# Patient Record
Sex: Female | Born: 1935 | Race: White | Hispanic: No | Smoking: Never smoker
Health system: Southern US, Community
[De-identification: ages and names within clinical notes are randomized; demographics above are authoritative.]

## PROBLEM LIST (undated history)

## (undated) DIAGNOSIS — K219 Gastro-esophageal reflux disease without esophagitis: Secondary | ICD-10-CM

## (undated) DIAGNOSIS — I10 Essential (primary) hypertension: Secondary | ICD-10-CM

## (undated) DIAGNOSIS — F0390 Unspecified dementia without behavioral disturbance: Secondary | ICD-10-CM

## (undated) DIAGNOSIS — H3530 Unspecified macular degeneration: Secondary | ICD-10-CM

## (undated) HISTORY — PX: ABDOMINAL HYSTERECTOMY: SHX81

## (undated) HISTORY — PX: COLONOSCOPY: SHX174

## (undated) HISTORY — PX: TOTAL HIP ARTHROPLASTY: SHX124

## (undated) HISTORY — DX: Gastro-esophageal reflux disease without esophagitis: K21.9

---

## 2003-05-01 ENCOUNTER — Other Ambulatory Visit: Payer: Self-pay

## 2004-06-23 ENCOUNTER — Ambulatory Visit: Payer: Self-pay | Admitting: Internal Medicine

## 2004-08-18 ENCOUNTER — Ambulatory Visit: Payer: Self-pay | Admitting: Internal Medicine

## 2005-02-10 ENCOUNTER — Ambulatory Visit: Payer: Self-pay | Admitting: Internal Medicine

## 2005-05-01 ENCOUNTER — Ambulatory Visit: Payer: Self-pay | Admitting: Unknown Physician Specialty

## 2005-06-15 ENCOUNTER — Ambulatory Visit: Payer: Self-pay | Admitting: Unknown Physician Specialty

## 2005-06-27 ENCOUNTER — Ambulatory Visit: Payer: Self-pay | Admitting: Ophthalmology

## 2005-07-03 ENCOUNTER — Ambulatory Visit: Payer: Self-pay | Admitting: Ophthalmology

## 2005-07-24 ENCOUNTER — Ambulatory Visit: Payer: Self-pay | Admitting: Ophthalmology

## 2005-07-25 ENCOUNTER — Encounter: Payer: Self-pay | Admitting: Rheumatology

## 2005-09-04 ENCOUNTER — Ambulatory Visit: Payer: Self-pay | Admitting: Internal Medicine

## 2006-02-27 ENCOUNTER — Ambulatory Visit: Payer: Self-pay | Admitting: Internal Medicine

## 2006-07-20 ENCOUNTER — Ambulatory Visit: Payer: Self-pay | Admitting: General Practice

## 2006-08-29 ENCOUNTER — Ambulatory Visit: Payer: Self-pay | Admitting: General Practice

## 2006-08-29 ENCOUNTER — Other Ambulatory Visit: Payer: Self-pay

## 2006-09-11 ENCOUNTER — Ambulatory Visit: Payer: Self-pay | Admitting: General Practice

## 2006-09-18 ENCOUNTER — Encounter: Payer: Self-pay | Admitting: General Practice

## 2006-10-18 ENCOUNTER — Encounter: Payer: Self-pay | Admitting: General Practice

## 2006-12-11 ENCOUNTER — Ambulatory Visit: Payer: Self-pay | Admitting: Family Medicine

## 2007-02-04 ENCOUNTER — Other Ambulatory Visit: Payer: Self-pay

## 2007-02-04 ENCOUNTER — Emergency Department: Payer: Self-pay | Admitting: Emergency Medicine

## 2007-12-12 ENCOUNTER — Ambulatory Visit: Payer: Self-pay | Admitting: Family Medicine

## 2010-01-28 ENCOUNTER — Ambulatory Visit: Payer: Self-pay | Admitting: Obstetrics and Gynecology

## 2010-07-04 ENCOUNTER — Ambulatory Visit: Payer: Self-pay | Admitting: Family Medicine

## 2011-01-02 ENCOUNTER — Emergency Department: Payer: Self-pay | Admitting: Emergency Medicine

## 2011-10-24 ENCOUNTER — Encounter: Payer: Self-pay | Admitting: Internal Medicine

## 2012-01-24 ENCOUNTER — Emergency Department: Payer: Self-pay

## 2012-01-24 LAB — CBC
HCT: 34.5 % — ABNORMAL LOW (ref 35.0–47.0)
HGB: 11.5 g/dL — ABNORMAL LOW (ref 12.0–16.0)
MCH: 27.1 pg (ref 26.0–34.0)
MCHC: 33.4 g/dL (ref 32.0–36.0)
Platelet: 332 10*3/uL (ref 150–440)
RDW: 13.1 % (ref 11.5–14.5)

## 2012-01-24 LAB — URINALYSIS, COMPLETE
Blood: NEGATIVE
Glucose,UR: NEGATIVE mg/dL (ref 0–75)
Ketone: NEGATIVE
Leukocyte Esterase: NEGATIVE
Nitrite: NEGATIVE
Protein: NEGATIVE
Specific Gravity: 1.01 (ref 1.003–1.030)

## 2012-01-24 LAB — COMPREHENSIVE METABOLIC PANEL
Albumin: 3.1 g/dL — ABNORMAL LOW (ref 3.4–5.0)
Alkaline Phosphatase: 100 U/L (ref 50–136)
Anion Gap: 5 — ABNORMAL LOW (ref 7–16)
Creatinine: 0.73 mg/dL (ref 0.60–1.30)
EGFR (African American): >60
EGFR (Non-African Amer.): >60
Glucose: 96 mg/dL (ref 65–99)
Osmolality: 276 (ref 275–301)
SGOT(AST): 21 U/L (ref 15–37)
Sodium: 138 mmol/L (ref 136–145)
Total Protein: 6.9 g/dL (ref 6.4–8.2)

## 2013-07-31 ENCOUNTER — Inpatient Hospital Stay: Payer: Self-pay | Admitting: Emergency Medicine

## 2013-07-31 LAB — COMPREHENSIVE METABOLIC PANEL
Albumin: 3.4 g/dL (ref 3.4–5.0)
Alkaline Phosphatase: 96 U/L
Anion Gap: 5 — ABNORMAL LOW (ref 7–16)
BILIRUBIN TOTAL: 0.3 mg/dL (ref 0.2–1.0)
BUN: 17 mg/dL (ref 7–18)
CO2: 31 mmol/L (ref 21–32)
CREATININE: 0.65 mg/dL (ref 0.60–1.30)
Calcium, Total: 9.2 mg/dL (ref 8.5–10.1)
Chloride: 101 mmol/L (ref 98–107)
EGFR (African American): >60
Glucose: 91 mg/dL (ref 65–99)
Osmolality: 275 (ref 275–301)
POTASSIUM: 4 mmol/L (ref 3.5–5.1)
SGOT(AST): 18 U/L (ref 15–37)
SGPT (ALT): 23 U/L (ref 12–78)
SODIUM: 137 mmol/L (ref 136–145)
TOTAL PROTEIN: 7.3 g/dL (ref 6.4–8.2)

## 2013-07-31 LAB — CBC WITH DIFFERENTIAL/PLATELET
BASOS ABS: 0.1 10*3/uL (ref 0.0–0.1)
Basophil %: 0.5 %
Eosinophil #: 0.1 10*3/uL (ref 0.0–0.7)
Eosinophil %: 0.8 %
HCT: 38.1 % (ref 35.0–47.0)
HGB: 12.6 g/dL (ref 12.0–16.0)
LYMPHS ABS: 2.4 10*3/uL (ref 1.0–3.6)
LYMPHS PCT: 17.1 %
MCH: 26.7 pg (ref 26.0–34.0)
MCHC: 33 g/dL (ref 32.0–36.0)
MCV: 81 fL (ref 80–100)
Monocyte #: 0.7 x10 3/mm (ref 0.2–0.9)
Monocyte %: 5 %
NEUTROS ABS: 10.9 10*3/uL — AB (ref 1.4–6.5)
NEUTROS PCT: 76.6 %
Platelet: 307 10*3/uL (ref 150–440)
RBC: 4.72 10*6/uL (ref 3.80–5.20)
RDW: 13.8 % (ref 11.5–14.5)
WBC: 14.3 10*3/uL — AB (ref 3.6–11.0)

## 2013-07-31 LAB — URINALYSIS, COMPLETE
BLOOD: NEGATIVE
Bacteria: NONE SEEN
Bilirubin,UR: NEGATIVE
Glucose,UR: NEGATIVE mg/dL (ref 0–75)
KETONE: NEGATIVE
LEUKOCYTE ESTERASE: NEGATIVE
Nitrite: NEGATIVE
Ph: 7 (ref 4.5–8.0)
Protein: NEGATIVE
RBC,UR: <1 /HPF (ref 0–5)
SPECIFIC GRAVITY: 1.008 (ref 1.003–1.030)
WBC UR: <1 /HPF (ref 0–5)

## 2013-07-31 LAB — PROTIME-INR
INR: 1
PROTHROMBIN TIME: 12.9 s (ref 11.5–14.7)

## 2013-07-31 LAB — TROPONIN I: Troponin-I: <0.02 ng/mL

## 2013-07-31 LAB — LIPASE, BLOOD: Lipase: 122 U/L (ref 73–393)

## 2013-07-31 LAB — MAGNESIUM: MAGNESIUM: 2 mg/dL

## 2013-07-31 LAB — APTT: Activated PTT: 36.7 secs — ABNORMAL HIGH (ref 23.6–35.9)

## 2013-08-02 LAB — BASIC METABOLIC PANEL
ANION GAP: 5 — AB (ref 7–16)
BUN: 14 mg/dL (ref 7–18)
CHLORIDE: 106 mmol/L (ref 98–107)
Calcium, Total: 8.3 mg/dL — ABNORMAL LOW (ref 8.5–10.1)
Co2: 26 mmol/L (ref 21–32)
Creatinine: 0.79 mg/dL (ref 0.60–1.30)
EGFR (African American): >60
Glucose: 103 mg/dL — ABNORMAL HIGH (ref 65–99)
OSMOLALITY: 275 (ref 275–301)
Potassium: 3.6 mmol/L (ref 3.5–5.1)
SODIUM: 137 mmol/L (ref 136–145)

## 2013-08-02 LAB — PLATELET COUNT: Platelet: 251 10*3/uL (ref 150–440)

## 2013-08-02 LAB — HEMOGLOBIN: HGB: 10.7 g/dL — ABNORMAL LOW (ref 12.0–16.0)

## 2013-08-03 LAB — HEMOGLOBIN: HGB: 12 g/dL (ref 12.0–16.0)

## 2013-08-04 ENCOUNTER — Encounter: Payer: Self-pay | Admitting: Internal Medicine

## 2013-08-05 LAB — PATHOLOGY REPORT

## 2013-08-17 ENCOUNTER — Encounter: Payer: Self-pay | Admitting: Internal Medicine

## 2014-10-10 NOTE — H&P (Signed)
PATIENT NAME:  [name], [name] MR#:  [id number] DATE OF BIRTH:  December 01, 1923  DATE OF ADMISSION:  07/31/2013  PRIMARY CARE PHYSICIAN: Dr. Terance Hart.   CHIEF COMPLAINT: Right hip pain after fall today.   HISTORY OF PRESENT ILLNESS: [name] is a pleasant 81 year old Caucasian female with past medical history of AFib in the remote past with history of hypertension, hypercholesteremia, not on any medications, who comes to the Emergency Room after she had a mechanical fall at her apartment today. She started having right hip pain and x-ray shows she has right femoral subcapital fracture. She is being admitted for further evaluation and management.   The patient denies any chest pain, shortness of breath. She is very functional, went grocery shopping and at the hair salon today. She continues to drive and is able to take care of herself. She does not have any history of MI in the past.   PAST MEDICAL HISTORY:  As listed.  1.  Osteoarthritis.  2.  Lumbar disk disease.  3.  History of atrial fibrillation, on aspirin.  4.  Hypertension, not on any medication.  5.  Osteopenia.  6.  Macular degeneration.  7.  History of melanoma.   PAST SURGICAL HISTORY:  1.  Hammertoe surgery.  2.  Hysterectomy.  3.  Cataract surgery x2 in 2006.  4.  Right knee arthroscopy in 2008.  5.  Bilateral varicose vein laser surgery.  6.  Carpal tunnel surgery.   MEDICATIONS:  1.  Vitamin B6 p.o. daily.  2.  Vitamin B12 p.o. daily.  3.  Ocuvite p.o. daily.  4.  Motrin 200 mg every 6 hours as needed.  5.  Metoprolol 50 mg half a tablet b.i.d.  6.  CoQ-10 100 one capsule daily.  7.  Aspirin 81 mg daily.   ALLERGIES: DELTASONE, FLOXIN, FOSAMAX, HYDROCHLOROTHIAZIDE, AND MOBIC.   FAMILY HISTORY: Positive for hypertension.   SOCIAL HISTORY: The patient lives by herself in her own apartment. She is very functional. Nonsmoker, nonalcoholic. She continues to drive.   REVIEW OF SYSTEMS:  CONSTITUTIONAL: No fever or  fatigue. Positive for right hip pain.  EYES: No blurred or double vision, glaucoma.  ENT: No tinnitus, ear pain, hearing loss or epistaxis.  RESPIRATORY: No cough, wheeze, hemoptysis or dyspnea.  CARDIOVASCULAR: No chest pain, orthopnea, edema, arrhythmia or dyspnea on exertion.  GASTROINTESTINAL: No nausea, vomiting, diarrhea, abdominal pain or hematemesis.  GENITOURINARY: No dysuria, hematuria, renal calculus.  ENDOCRINE: No polyuria, nocturia or thyroid problems.  HEMATOLOGY: No anemia, easy bruising or bleeding.  SKIN: No acne, rash, or lesion.  MUSCULOSKELETAL: Positive for arthritis, swelling and gout. NEUROLOGIC: No numbness, weakness, dysarthria, or CVA.  PSYCHIATRIC: No anxiety, depression or bipolar disorder.   All other systems reviewed and negative.   FAMILY HISTORY: Positive for hypertension.   FAMILY HISTORY: Positive for hypertension and stroke. Father with colon cancer, sister with breast cancer.   PHYSICAL EXAMINATION:  GENERAL: Awake, alert, oriented x3, not in acute distress.  VITAL SIGNS: Afebrile. Blood pressure is 167/70. Sats are 95% on room air.  HEENT: Atraumatic, normocephalic. PERRLA. EOM intact. Oral mucosa is moist.  NECK: Supple. No JVD. No carotid bruit.  LUNGS: Clear to auscultation bilaterally. No rales, rhonchi, respiratory distress or labored breathing.  HEART: Both the heart sounds are normal. Rate, rhythm regular. PMI not lateralized.  CHEST: Nontender.  EXTREMITIES: Good pedal pulses, good femoral pulses. No lower extremity edema.  ABDOMEN: Soft, benign, nontender. No organomegaly. Positive bowel sounds.  NEUROLOGIC:  Grossly intact cranial nerves II through XII. No motor or sensory deficit.  PSYCHIATRIC: Awake, alert, oriented x3.   DIAGNOSTIC STUDIES: EKG shows normal sinus rhythm with left bundle branch block which is consistent with EKG from 2013.   Comprehensive metabolic panel within normal limits. Lipase is 122. Magnesium is 2. PT/INR is  12 and 1.0.   Right hip x-ray shows subcapital right femoral neck fracture.   ASSESSMENT AND PLAN: This is 81 year old [name] with history of chronic atrial fibrillation, now in sinus rhythm, history of osteoarthritis, who comes in with:  1.  Right subcapital femur fracture after a mechanical fall at her apartment today. The patient will be admitted on the orthopedic service. Dr. Ernest PineHooten has been consulted. Input appreciated. The patient is at a low risk for hip surgery at this time. Okay to proceed with surgery. She does not have any history of coronary artery disease. She is currently in sinus rhythm and has a remote history of atrial fibrillation the past. The patient denies any chest pain and is very functional.  2.  History of atrial fibrillation. Currently, the patient is in sinus rhythm. Continue aspirin after surgery.  3.  Hypertension. On metoprolol. Will continue the same in the perioperative period.  4.  Deep vein thrombosis prophylaxis. Subcu heparin.  5.  Further workup according to the patient's clinical course.   Hospital admission plan was discussed with the patient and the patient's daughter, Delbert HarnessJeanie Ward.   TIME SPENT: 50 minutes.   ____________________________ Wylie HailSona A. Allena KatzPatel, MD sap:np D: 07/31/2013 21:09:50 ET T: 07/31/2013 21:46:15 ET JOB#: [id number]  cc: Sharlette Jansma A. Allena KatzPatel, MD, <Dictator> Teena Iraniavid M. Terance HartBronstein, MD Willow OraSONA A Cena Bruhn MD ELECTRONICALLY SIGNED 08/07/2013 17:13

## 2014-10-10 NOTE — Discharge Summary (Signed)
PATIENT NAME:  [name], [name] MR#:  [id number] DATE OF BIRTH:  08/10/1923  DATE OF ADMISSION:  07/31/2013 DATE OF DISCHARGE:  08/04/2013  ADMITTING PHYSICIAN: Enedina FinnerSona Patel, MD  DISCHARGING PHYSICIAN: Enid Baasadhika Emrah Ariola, MD  PRIMARY CARE PHYSICIAN: Dr. Terance HartBronstein.  CONSULTANTS IN HOSPITAL: Orthopedics by Dr. Ernest PineHooten.   DISCHARGE DIAGNOSES: 1.  Fall and right subcapital fracture status post hemiarthroplasty.  2.  Paroxysmal atrial fibrillation. 3.  Left bundle branch block.  4.  Gastroesophageal reflux disease.  5. Osteoarthritis, lumbar disk disease, and osteopenia. 6.  Macular degeneration.  7.  History of melanoma.   DISCHARGE HOME MEDICATIONS: 1.  Aspirin 81 mg p.o. daily.  2.  Co-enzyme Q 100 mg 1 capsule daily.  3.  Vitamin B12 1 tablet p.o. daily. 4.  Vitamin B6 1 tablet p.o. daily.  5.  Multivitamin 1 tablet p.o. daily.  6.  Metoprolol 25 mg p.o. b.i.d.  7.  Tylenol 500 mg q. 4 hours p.r.n. for pain or fever.  8.  Oxycodone 5 mg q. 4 hours p.r.n. for pain.  9.  Tramadol 50 mg q. 4 hours p.r.n. for pain. 10. Lovenox 40 mg subcutaneous daily for 14 days.  11.  Senna Colace 8.6/50 mg 1 tablet p.o. b.i.d.  12.  Magnesium hydroxide 30 mL b.i.d. p.r.n. for constipation.   DISCHARGE DIET: Low-sodium.  DISCHARGE ACTIVITY: As tolerated.    DISCHARGE INSTRUCTIONS: 1.  Follow up with PCP in 1 week. 2.  Follow up with orthopedics in 2 weeks, Dr. Ernest PineHooten. 3.  Physical therapy.   LABS AND IMAGING STUDIES PRIOR TO DISCHARGE: Sodium 137, potassium 3.6, chloride 106, bicarb 26, BUN 14, creatinine 0.79, glucose 103, and calcium 8.3.   WBC 14.3, hemoglobin 12, hematocrit 38.1, platelet count 307. Lipase 122. Magnesium 2. INR 1. Troponins negative. UA negative for infection.   Chest x-ray on admission showing clear lung fields.   Right hip x-ray on admission showing subcapital right femoral neck fracture.   BRIEF HOSPITAL COURSE: [name] is an 81 year old pleasant Caucasian female with  past medical history of paroxysmal atrial fibrillation on aspirin, hypertension, and osteopenia who is very active at baseline, presents after a mechanical fall and right hip fracture.  1.  Mechanical fall and right hip subcapital fracture status post hemiarthroplasty done by Dr. Ernest PineHooten on August 01, 2013. Postoperatively the patient has done extensively well. She has not had any complications, minimal postop anemia that resolved without any treatment. The patient worked with physical therapy. Pain is better controlled. She had a bowel movement, and [facility].   2.  Paroxysmal atrial fibrillation. She was in normal sinus rhythm prior to surgery. Had an episode of intense pain postoperative day 2, converted back into A-fib but is back to sinus at the time of discharge. The patient is currently only on minimal anticoagulation with aspirin as an outpatient, which will be continued. She will be getting Lovenox for DVT prophylaxis anyway for the next 2 weeks and will follow up with PCP as an outpatient.  3.  Hypertension. Continue metoprolol.   Her course has been otherwise uneventful in the hospital.   DISCHARGE CONDITION: Stable.   DISCHARGE DISPOSITION: [facility] short-term rehab.   TIME SPENT ON DISCHARGE: 45 minutes.   ____________________________ Enid Baasadhika Keishon Chavarin, MD rk:sb D: 08/04/2013 11:47:10 ET T: 08/04/2013 12:20:47 ET JOB#: [id number]  cc: Enid Baasadhika Scout Guyett, MD, <Dictator> Illene LabradorJames P. Angie FavaHooten Jr., MD Teena Iraniavid M. Terance HartBronstein, MD Enid BaasADHIKA Shaarav Ripple MD ELECTRONICALLY SIGNED 08/05/2013 15:05

## 2014-10-10 NOTE — Op Note (Signed)
PATIENT NAME:  [name], [name] MR#:  [id number] DATE OF BIRTH:  [birth year]  DATE OF PROCEDURE:  08/01/2013  PREOPERATIVE DIAGNOSIS: Displaced right femoral neck fracture.   POSTOPERATIVE DIAGNOSIS: Displaced right femoral neck fracture.   PROCEDURE PERFORMED: Right hip hemiarthroplasty.   SURGEON: Illene LabradorJames P. Hooten, M.D.   ASSISTANT:  Van ClinesJon Wolfe.   ANESTHESIA: Spinal.   ESTIMATED BLOOD LOSS: 100 mL.   FLUIDS REPLACED: 1300 mL of crystalloid.   DRAINS: Two medium drains to Hemovac reservoir.   IMPLANTS UTILIZED: DePuy size 2 Summit femoral stem (cemented), 11 mm Cementralizer, a 45 mm Cathcart hip ball, a +5 mm tapered spacer, and a size 2 cement restrictor.   INDICATIONS FOR SURGERY: The patient is an 81 year old female who fell and landed on her right hip and side. X-rays demonstrated a displaced right femoral neck fracture. After discussion of the risks and benefits of surgical intervention, the patient expressed understanding of the risks, benefits, and agreed with plans for surgical intervention.   PROCEDURE IN DETAIL: The patient was brought to the operating room and after adequate spinal anesthesia was achieved, the patient was placed in left lateral decubitus position. Axillary roll was placed and all bony prominences were well padded. The patient's right hip and leg were cleaned and prepped with alcohol and DuraPrep draped in the usual sterile fashion. A "timeout" was performed as per usual protocol. A lateral curvilinear incision was made gently curving towards the posterior superior iliac spine. IT band was incised in line with the skin incision. Fibers of the gluteus maximus were split in line. Piriformis tendon was identified, skeletonized, incised at its insertion at the proximal femur and reflected posteriorly. In a similar fashion, short external rotators were incised and reflected posteriorly. A T-type posterior capsulotomy was performed. The femoral head was removed using a  corkscrew device and measured using calipers.  It was felt that a 45 mm diameter was appropriate. The acetabulum was inspected and articular cartilage felt to be in excellent condition. Several small bony fragments were removed from the acetabulum. The femoral neck cut was then performed using an oscillating saw. A pilot hole was created, and conical reamer was inserted. Serial broaches were inserted up to a size 2 broach. The calcar region was planed and trial reduction was performed with a 45 mm outer diameter hip ball with first a +0 and subsequently a +5 mm neck length. The +5 mm neck length allowed for appropriate restoration of limb length. Excellent stability was noted both anteriorly and posteriorly. Trial components were removed. The canal was measured and it was felt that a size 2 cement restrictor was appropriate. The cement restrictor was inserted to the appropriate depth. Proximal femoral canal was irrigated with copious amounts of normal saline with antibiotic solution using pulsatile lavage and then suctioned dry. The canal was then packed with vaginal packing, soaked in dilute Neo-Synephrine. Polymethyl methacrylate cement was prepared in the usual fashion using a vacuum mixer. Vaginal packing was removed and the canal again suctioned dry. Cement was introduced in a retrograde fashion and pressurized. A size 2 Summit femoral stem with an 11 mm Cementralizer was positioned and impacted into place. Excess cement was removed using freer elevators. After adequate curing of cement, the Morse taper was cleaned and dried and a 45 mm outer diameter Cathcart hip ball with a +5 mm tapered spacer was placed on the trunnion and impacted into place. The acetabulum was again irrigated and suctioned dry and inspected for any debris. The  hip was reduced and placed through range of motion. Again, excellent stability was noted both anteriorly and posteriorly. Good equalization of limb lengths was noted. The wound was  irrigated with copious amounts of normal saline with antibiotic solution using pulsatile lavage and then suctioned dry. The posterior capsulotomy was repaired using #5 Ethibond. The piriformis tendon was reapproximated on the surface of the gluteus medius tendon using #5 Ethibond. Two medium drains were placed in the wound bed and brought out through a separate stab incision to be attached to a Hemovac reservoir. IT band was repaired using interrupted sutures of #1 Vicryl. The subcutaneous tissue was approximated in layers using first #0 Vicryl followed by #2-0 Vicryl. Skin was closed with skin staples. A sterile dressing was applied.   The patient tolerated the procedure well. She was transported to the recovery room in stable condition.    ____________________________ Illene Labrador. Angie Fava., MD jph:dp D: 08/02/2013 07:24:40 ET T: 08/02/2013 07:52:19 ET JOB#: [id number]  cc: Fayrene Fearing P. Angie Fava., MD, <Dictator> Illene Labrador Angie Fava MD ELECTRONICALLY SIGNED 08/08/2013 6:43

## 2014-10-10 NOTE — Consult Note (Signed)
PATIENT NAME:  [name], KAYES MR#:  [id number] DATE OF BIRTH:  1923/07/11  DATE OF CONSULTATION:  07/31/2013  REFERRING PHYSICIAN:  Eartha Inch. York Cerise, MD CONSULTING PHYSICIAN:  Illene Labrador. Angie Fava., MD  CHIEF COMPLAINT: Right hip pain.   HISTORY OF PRESENT ILLNESS: The patient is an 81 year old female who sustained a mechanical fall while at home, landing on her right hip and side. She complained of right hip pain and was unable to stand or bear weight due to the right hip pain. She denied any other injuries. She denied any loss of consciousness.   PAST MEDICAL HISTORY: Atrial fibrillation, positive PPD, hypertension, hypercholesterolemia, osteopenia, peripheral vascular disease of the right lower extremity, macular degeneration, melanoma, status post hammertoe procedure, hysterectomy, cardiac catheterization, cataract extraction x 2, right knee arthroscopy, varicose vein surgery, bilateral carpal tunnel release, laser surgery to the eyes.   ALLERGIES: DELTASONE, FLOXIN, FOSAMAX, HYDROCHLOROTHIAZIDE AND MOBIC.   MEDICATIONS AT THE TIME OF ADMISSION: Vitamin B6 daily, vitamin B12 daily, Ocuvite 1 tablet daily, Motrin 200 mg q.6 hours p.r.n., metoprolol tartrate 50 mg 1/2 tablet twice a day, Coenzyme-Q10 100 mg daily, aspirin 81 mg daily.   SOCIAL HISTORY: The patient is a widow and lives by herself at [place]. She denies any current tobacco or alcohol use.   FAMILY HISTORY: Positive for CVA and hypertension in her mother, colon cancer in her father, breast cancer in a sister, and coronary artery disease in a brother.   REVIEW OF SYSTEMS: Negative for chest pain, shortness of breath, dyspnea on exertion. Pertinent musculoskeletal exam is only notable for right hip pain.   PHYSICAL EXAMINATION:  GENERAL: The patient is a pleasant, well-developed, well-nourished, elderly female seen in no acute distress.  HEENT: Atraumatic, normocephalic. Sclerae are clear. Extraocular motions intact. Oropharynx is  clear with moist mucosa.  NECK: Supple, nontender, with good range of motion. No JVD or carotid bruits.  LUNGS: Clear to auscultation bilaterally.  CARDIAC: Regular rate and rhythm with normal S1, S2. There is a 2/6 systolic murmur. No gallops or rubs. Strong pedal pulses are noted bilaterally. No significant pretibial or ankle edema.  ABDOMEN: Soft, nontender, nondistended. Bowel sounds present.  MUSCULOSKELETAL: Good range of motion, strength, and stability of the upper extremities. Examination of lower extremity demonstrates pain with attempted range of motion of the right hip. Only mild shortening is appreciated to the right lower extremity without gross rotation. No significant bruising about the hip or buttocks at the present time. No swelling or tenderness to the either knee. No knee effusion.  NEUROLOGIC: Awake, alert, and oriented. Sensory function is grossly intact throughout. Motor strength is felt to 5/5 with the exception of the right lower extremity which was not assessed due to the injury. No tremor. Good motor coordination.   X-RAYS: AP pelvis and AP and lateral radiographs of the right hip were reviewed. There is a displaced femoral neck fracture. Good preservation of the cartilage space is noted without significant degenerative change appreciated. Good cortical bone was noted to the proximal femur.   IMPRESSION: Displaced right femoral neck fracture.   PLAN: The findings were discussed in detail with the patient and her daughter. Recommendation was made for right hip hemiarthroplasty. Risks and benefits of surgical intervention were discussed. The usual perioperative course was also reviewed. The patient expressed understanding of the risks and benefits and agreed with plans for surgical intervention.   Right site surgery protocol was performed.   ____________________________ Illene Labrador. Angie Fava., MD jph:gb D:  07/31/2013 21:02:04 ET T: 07/31/2013 21:25:46  ET JOB#: [id number]  cc: Fayrene FearingJames P. Angie FavaHooten Jr., MD, <Dictator> Illene LabradorJAMES P Angie FavaHOOTEN JR MD ELECTRONICALLY SIGNED 08/08/2013 6:43

## 2014-10-10 NOTE — Consult Note (Signed)
Brief Consult Note: Diagnosis: right femoral neck fracture.   Patient was seen by consultant.   Consult note dictated.   Comments: Recommend right hip hemiarthroplasty.  The risks and benefits of surgical intervention were discussed in detail with the patient and her daughter. They expressed understanding of the risks and benefits and agreed with plans for surgery.   Surgical site signed as per "right site surgery" protocol.   The potential risks and benefits of blood transfusion have been discussed with the patient.The patient expressed understanding of the risks and benefits and has signed the appropriate consent for blood transfusion.  Electronic Signatures: Donato HeinzHooten, James P (MD)  (Signed 12-Feb-15 20:55)  Authored: Brief Consult Note   Last Updated: 12-Feb-15 20:55 by Donato HeinzHooten, James P (MD)

## 2015-07-01 DIAGNOSIS — H353131 Nonexudative age-related macular degeneration, bilateral, early dry stage: Secondary | ICD-10-CM | POA: Diagnosis not present

## 2015-09-01 DIAGNOSIS — H353122 Nonexudative age-related macular degeneration, left eye, intermediate dry stage: Secondary | ICD-10-CM | POA: Diagnosis not present

## 2015-09-01 DIAGNOSIS — H35373 Puckering of macula, bilateral: Secondary | ICD-10-CM | POA: Diagnosis not present

## 2015-09-01 DIAGNOSIS — H353211 Exudative age-related macular degeneration, right eye, with active choroidal neovascularization: Secondary | ICD-10-CM | POA: Diagnosis not present

## 2015-09-01 DIAGNOSIS — H43813 Vitreous degeneration, bilateral: Secondary | ICD-10-CM | POA: Diagnosis not present

## 2015-11-17 DIAGNOSIS — I10 Essential (primary) hypertension: Secondary | ICD-10-CM | POA: Diagnosis not present

## 2015-11-17 DIAGNOSIS — R413 Other amnesia: Secondary | ICD-10-CM | POA: Diagnosis not present

## 2015-11-17 DIAGNOSIS — M1710 Unilateral primary osteoarthritis, unspecified knee: Secondary | ICD-10-CM | POA: Diagnosis not present

## 2015-11-17 DIAGNOSIS — K219 Gastro-esophageal reflux disease without esophagitis: Secondary | ICD-10-CM | POA: Diagnosis not present

## 2015-11-19 DIAGNOSIS — M25561 Pain in right knee: Secondary | ICD-10-CM | POA: Diagnosis not present

## 2015-12-28 DIAGNOSIS — H353212 Exudative age-related macular degeneration, right eye, with inactive choroidal neovascularization: Secondary | ICD-10-CM | POA: Diagnosis not present

## 2015-12-28 DIAGNOSIS — H353122 Nonexudative age-related macular degeneration, left eye, intermediate dry stage: Secondary | ICD-10-CM | POA: Diagnosis not present

## 2015-12-28 DIAGNOSIS — H35373 Puckering of macula, bilateral: Secondary | ICD-10-CM | POA: Diagnosis not present

## 2015-12-28 DIAGNOSIS — H43813 Vitreous degeneration, bilateral: Secondary | ICD-10-CM | POA: Diagnosis not present

## 2016-02-10 DIAGNOSIS — I10 Essential (primary) hypertension: Secondary | ICD-10-CM | POA: Diagnosis not present

## 2016-02-10 DIAGNOSIS — L298 Other pruritus: Secondary | ICD-10-CM | POA: Diagnosis not present

## 2016-02-10 DIAGNOSIS — I4891 Unspecified atrial fibrillation: Secondary | ICD-10-CM | POA: Diagnosis not present

## 2016-03-02 DIAGNOSIS — L821 Other seborrheic keratosis: Secondary | ICD-10-CM | POA: Diagnosis not present

## 2016-03-02 DIAGNOSIS — L300 Nummular dermatitis: Secondary | ICD-10-CM | POA: Diagnosis not present

## 2016-03-02 DIAGNOSIS — Z08 Encounter for follow-up examination after completed treatment for malignant neoplasm: Secondary | ICD-10-CM | POA: Diagnosis not present

## 2016-03-02 DIAGNOSIS — Z85828 Personal history of other malignant neoplasm of skin: Secondary | ICD-10-CM | POA: Diagnosis not present

## 2016-03-30 DIAGNOSIS — H353212 Exudative age-related macular degeneration, right eye, with inactive choroidal neovascularization: Secondary | ICD-10-CM | POA: Diagnosis not present

## 2016-05-17 DIAGNOSIS — H35373 Puckering of macula, bilateral: Secondary | ICD-10-CM | POA: Diagnosis not present

## 2016-05-17 DIAGNOSIS — H43813 Vitreous degeneration, bilateral: Secondary | ICD-10-CM | POA: Diagnosis not present

## 2016-05-17 DIAGNOSIS — H353211 Exudative age-related macular degeneration, right eye, with active choroidal neovascularization: Secondary | ICD-10-CM | POA: Diagnosis not present

## 2016-05-17 DIAGNOSIS — H353122 Nonexudative age-related macular degeneration, left eye, intermediate dry stage: Secondary | ICD-10-CM | POA: Diagnosis not present

## 2016-05-22 DIAGNOSIS — M79605 Pain in left leg: Secondary | ICD-10-CM | POA: Diagnosis not present

## 2016-05-22 DIAGNOSIS — I10 Essential (primary) hypertension: Secondary | ICD-10-CM | POA: Diagnosis not present

## 2016-05-22 DIAGNOSIS — R413 Other amnesia: Secondary | ICD-10-CM | POA: Diagnosis not present

## 2016-05-24 DIAGNOSIS — M79605 Pain in left leg: Secondary | ICD-10-CM | POA: Diagnosis not present

## 2016-05-24 DIAGNOSIS — I10 Essential (primary) hypertension: Secondary | ICD-10-CM | POA: Diagnosis not present

## 2016-06-12 ENCOUNTER — Inpatient Hospital Stay: Payer: PPO

## 2016-06-12 ENCOUNTER — Inpatient Hospital Stay: Payer: PPO | Admitting: Anesthesiology

## 2016-06-12 ENCOUNTER — Inpatient Hospital Stay
Admission: EM | Admit: 2016-06-12 | Discharge: 2016-06-16 | DRG: 469 | Disposition: A | Discharge status: Skilled Nursing Facility | Payer: PPO | Attending: Internal Medicine | Admitting: Internal Medicine

## 2016-06-12 ENCOUNTER — Emergency Department: Payer: PPO

## 2016-06-12 ENCOUNTER — Encounter
Admission: EM | Disposition: A | Discharge status: Skilled Nursing Facility | Payer: Self-pay | Source: Home / Self Care | Attending: Internal Medicine

## 2016-06-12 ENCOUNTER — Encounter: Payer: Self-pay | Admitting: Emergency Medicine

## 2016-06-12 DIAGNOSIS — M25552 Pain in left hip: Secondary | ICD-10-CM | POA: Diagnosis not present

## 2016-06-12 DIAGNOSIS — R0989 Other specified symptoms and signs involving the circulatory and respiratory systems: Secondary | ICD-10-CM | POA: Diagnosis not present

## 2016-06-12 DIAGNOSIS — H3530 Unspecified macular degeneration: Secondary | ICD-10-CM | POA: Diagnosis not present

## 2016-06-12 DIAGNOSIS — T148XXA Other injury of unspecified body region, initial encounter: Secondary | ICD-10-CM | POA: Diagnosis not present

## 2016-06-12 DIAGNOSIS — F0390 Unspecified dementia without behavioral disturbance: Secondary | ICD-10-CM | POA: Diagnosis present

## 2016-06-12 DIAGNOSIS — J45909 Unspecified asthma, uncomplicated: Secondary | ICD-10-CM | POA: Diagnosis not present

## 2016-06-12 DIAGNOSIS — Z66 Do not resuscitate: Secondary | ICD-10-CM | POA: Diagnosis present

## 2016-06-12 DIAGNOSIS — S72042A Displaced fracture of base of neck of left femur, initial encounter for closed fracture: Secondary | ICD-10-CM | POA: Diagnosis not present

## 2016-06-12 DIAGNOSIS — R600 Localized edema: Secondary | ICD-10-CM | POA: Diagnosis not present

## 2016-06-12 DIAGNOSIS — S72002A Fracture of unspecified part of neck of left femur, initial encounter for closed fracture: Secondary | ICD-10-CM | POA: Diagnosis not present

## 2016-06-12 DIAGNOSIS — R001 Bradycardia, unspecified: Secondary | ICD-10-CM | POA: Diagnosis present

## 2016-06-12 DIAGNOSIS — Z79899 Other long term (current) drug therapy: Secondary | ICD-10-CM | POA: Diagnosis not present

## 2016-06-12 DIAGNOSIS — S72009A Fracture of unspecified part of neck of unspecified femur, initial encounter for closed fracture: Secondary | ICD-10-CM | POA: Diagnosis not present

## 2016-06-12 DIAGNOSIS — I10 Essential (primary) hypertension: Secondary | ICD-10-CM | POA: Diagnosis present

## 2016-06-12 DIAGNOSIS — E8770 Fluid overload, unspecified: Secondary | ICD-10-CM | POA: Diagnosis not present

## 2016-06-12 DIAGNOSIS — W19XXXA Unspecified fall, initial encounter: Secondary | ICD-10-CM | POA: Diagnosis present

## 2016-06-12 DIAGNOSIS — M6281 Muscle weakness (generalized): Secondary | ICD-10-CM | POA: Diagnosis not present

## 2016-06-12 DIAGNOSIS — R2689 Other abnormalities of gait and mobility: Secondary | ICD-10-CM | POA: Diagnosis not present

## 2016-06-12 DIAGNOSIS — Z7982 Long term (current) use of aspirin: Secondary | ICD-10-CM | POA: Diagnosis not present

## 2016-06-12 DIAGNOSIS — J189 Pneumonia, unspecified organism: Secondary | ICD-10-CM | POA: Diagnosis not present

## 2016-06-12 DIAGNOSIS — S299XXA Unspecified injury of thorax, initial encounter: Secondary | ICD-10-CM | POA: Diagnosis not present

## 2016-06-12 DIAGNOSIS — R05 Cough: Secondary | ICD-10-CM | POA: Diagnosis not present

## 2016-06-12 DIAGNOSIS — S72012A Unspecified intracapsular fracture of left femur, initial encounter for closed fracture: Secondary | ICD-10-CM | POA: Diagnosis not present

## 2016-06-12 DIAGNOSIS — T1490XA Injury, unspecified, initial encounter: Secondary | ICD-10-CM

## 2016-06-12 DIAGNOSIS — Z96642 Presence of left artificial hip joint: Secondary | ICD-10-CM | POA: Diagnosis not present

## 2016-06-12 DIAGNOSIS — Y92000 Kitchen of unspecified non-institutional (private) residence as  the place of occurrence of the external cause: Secondary | ICD-10-CM | POA: Diagnosis not present

## 2016-06-12 DIAGNOSIS — R609 Edema, unspecified: Secondary | ICD-10-CM

## 2016-06-12 DIAGNOSIS — R059 Cough, unspecified: Secondary | ICD-10-CM

## 2016-06-12 DIAGNOSIS — F329 Major depressive disorder, single episode, unspecified: Secondary | ICD-10-CM | POA: Diagnosis not present

## 2016-06-12 DIAGNOSIS — S72002D Fracture of unspecified part of neck of left femur, subsequent encounter for closed fracture with routine healing: Secondary | ICD-10-CM | POA: Diagnosis not present

## 2016-06-12 DIAGNOSIS — K219 Gastro-esophageal reflux disease without esophagitis: Secondary | ICD-10-CM | POA: Diagnosis not present

## 2016-06-12 DIAGNOSIS — R262 Difficulty in walking, not elsewhere classified: Secondary | ICD-10-CM

## 2016-06-12 DIAGNOSIS — Z9181 History of falling: Secondary | ICD-10-CM | POA: Diagnosis not present

## 2016-06-12 DIAGNOSIS — G8918 Other acute postprocedural pain: Secondary | ICD-10-CM

## 2016-06-12 DIAGNOSIS — Z823 Family history of stroke: Secondary | ICD-10-CM | POA: Diagnosis not present

## 2016-06-12 HISTORY — PX: TOTAL HIP ARTHROPLASTY: SHX124

## 2016-06-12 HISTORY — DX: Unspecified dementia, unspecified severity, without behavioral disturbance, psychotic disturbance, mood disturbance, and anxiety: F03.90

## 2016-06-12 HISTORY — DX: Essential (primary) hypertension: I10

## 2016-06-12 HISTORY — DX: Unspecified macular degeneration: H35.30

## 2016-06-12 LAB — CBC WITH DIFFERENTIAL/PLATELET
Basophils Absolute: 0.1 10*3/uL (ref 0–0.1)
Basophils Relative: 1 %
Eosinophils Absolute: 0.1 10*3/uL (ref 0–0.7)
Eosinophils Relative: 2 %
HEMATOCRIT: 36.5 % (ref 35.0–47.0)
Hemoglobin: 12.4 g/dL (ref 12.0–16.0)
Lymphocytes Relative: 30 %
Lymphs Abs: 1.9 10*3/uL (ref 1.0–3.6)
MCH: 27.2 pg (ref 26.0–34.0)
MCHC: 34.1 g/dL (ref 32.0–36.0)
MCV: 79.9 fL — AB (ref 80.0–100.0)
MONO ABS: 0.7 10*3/uL (ref 0.2–0.9)
MONOS PCT: 12 %
NEUTROS ABS: 3.6 10*3/uL (ref 1.4–6.5)
Neutrophils Relative %: 55 %
Platelets: 258 10*3/uL (ref 150–440)
RBC: 4.57 MIL/uL (ref 3.80–5.20)
RDW: 13.6 % (ref 11.5–14.5)
WBC: 6.4 10*3/uL (ref 3.6–11.0)

## 2016-06-12 LAB — URINALYSIS, COMPLETE (UACMP) WITH MICROSCOPIC
Bacteria, UA: NONE SEEN
Bilirubin Urine: NEGATIVE
GLUCOSE, UA: NEGATIVE mg/dL
Hgb urine dipstick: NEGATIVE
KETONES UR: NEGATIVE mg/dL
Leukocytes, UA: NEGATIVE
NITRITE: NEGATIVE
PH: 7 (ref 5.0–8.0)
Protein, ur: NEGATIVE mg/dL
Specific Gravity, Urine: 1.009 (ref 1.005–1.030)
Squamous Epithelial / LPF: NONE SEEN

## 2016-06-12 LAB — COMPREHENSIVE METABOLIC PANEL
ALK PHOS: 74 U/L (ref 38–126)
ALT: 16 U/L (ref 14–54)
AST: 18 U/L (ref 15–41)
Albumin: 3.7 g/dL (ref 3.5–5.0)
Anion gap: 4 — ABNORMAL LOW (ref 5–15)
BILIRUBIN TOTAL: 0.6 mg/dL (ref 0.3–1.2)
BUN: 15 mg/dL (ref 6–20)
CALCIUM: 9.1 mg/dL (ref 8.9–10.3)
CO2: 29 mmol/L (ref 22–32)
Chloride: 99 mmol/L — ABNORMAL LOW (ref 101–111)
Creatinine, Ser: 0.62 mg/dL (ref 0.44–1.00)
GFR calc non Af Amer: >60 mL/min (ref >60)
Glucose, Bld: 101 mg/dL — ABNORMAL HIGH (ref 65–99)
Potassium: 4.1 mmol/L (ref 3.5–5.1)
Sodium: 132 mmol/L — ABNORMAL LOW (ref 135–145)
TOTAL PROTEIN: 6.5 g/dL (ref 6.5–8.1)

## 2016-06-12 SURGERY — ARTHROPLASTY, HIP, TOTAL, ANTERIOR APPROACH
Anesthesia: General | Laterality: Left

## 2016-06-12 MED ORDER — PHENOL 1.4 % MT LIQD
1.0000 | OROMUCOSAL | Status: DC | PRN
  Filled 2016-06-12: qty 177

## 2016-06-12 MED ORDER — PROPOFOL 10 MG/ML IV BOLUS
INTRAVENOUS | Status: AC
  Filled 2016-06-12: qty 20

## 2016-06-12 MED ORDER — FENTANYL CITRATE (PF) 100 MCG/2ML IJ SOLN
INTRAMUSCULAR | Status: DC | PRN
  Administered 2016-06-12: 50 ug via INTRAVENOUS

## 2016-06-12 MED ORDER — OCUVITE ADULT 50+ PO CAPS: 1.0000 | ORAL_CAPSULE | Freq: Every day | ORAL | Status: DC

## 2016-06-12 MED ORDER — PHENYLEPHRINE HCL 10 MG/ML IJ SOLN
INTRAMUSCULAR | Status: AC
  Filled 2016-06-12: qty 1

## 2016-06-12 MED ORDER — SUGAMMADEX SODIUM 200 MG/2ML IV SOLN
INTRAVENOUS | Status: DC | PRN
  Administered 2016-06-12: 140 mg via INTRAVENOUS

## 2016-06-12 MED ORDER — MORPHINE SULFATE (PF) 2 MG/ML IV SOLN: 2.0000 mg | INTRAVENOUS | Status: DC | PRN

## 2016-06-12 MED ORDER — PHENYLEPHRINE 40 MCG/ML (10ML) SYRINGE FOR IV PUSH (FOR BLOOD PRESSURE SUPPORT)
PREFILLED_SYRINGE | INTRAVENOUS | Status: AC
  Filled 2016-06-12: qty 10

## 2016-06-12 MED ORDER — ONDANSETRON HCL 4 MG/2ML IJ SOLN
INTRAMUSCULAR | Status: AC
  Filled 2016-06-12: qty 2

## 2016-06-12 MED ORDER — MAGNESIUM HYDROXIDE 400 MG/5ML PO SUSP
30.0000 mL | Freq: Every day | ORAL | Status: DC | PRN
  Administered 2016-06-13: 30 mL via ORAL
  Filled 2016-06-12: qty 30

## 2016-06-12 MED ORDER — ENOXAPARIN SODIUM 40 MG/0.4ML ~~LOC~~ SOLN
40.0000 mg | SUBCUTANEOUS | Status: DC
  Administered 2016-06-13 – 2016-06-16 (×4): 40 mg via SUBCUTANEOUS
  Filled 2016-06-12 (×4): qty 0.4

## 2016-06-12 MED ORDER — BUPIVACAINE-EPINEPHRINE (PF) 0.25% -1:200000 IJ SOLN
INTRAMUSCULAR | Status: AC
  Filled 2016-06-12: qty 30

## 2016-06-12 MED ORDER — PROPOFOL 10 MG/ML IV BOLUS
INTRAVENOUS | Status: DC | PRN
  Administered 2016-06-12: 60 mg via INTRAVENOUS

## 2016-06-12 MED ORDER — OXYCODONE HCL 5 MG PO TABS: 5.0000 mg | ORAL_TABLET | Freq: Once | ORAL | Status: DC | PRN

## 2016-06-12 MED ORDER — VITAMIN B-12 1000 MCG PO TABS
1000.0000 ug | ORAL_TABLET | Freq: Every day | ORAL | Status: DC
  Administered 2016-06-13 – 2016-06-16 (×4): 1000 ug via ORAL
  Filled 2016-06-12 (×4): qty 1

## 2016-06-12 MED ORDER — ALUM & MAG HYDROXIDE-SIMETH 200-200-20 MG/5ML PO SUSP: 30.0000 mL | ORAL | Status: DC | PRN

## 2016-06-12 MED ORDER — ACETAMINOPHEN 325 MG PO TABS: 650.0000 mg | ORAL_TABLET | Freq: Four times a day (QID) | ORAL | Status: DC | PRN

## 2016-06-12 MED ORDER — SODIUM CHLORIDE 0.9 % IV SOLN: INTRAVENOUS | Status: DC

## 2016-06-12 MED ORDER — FENTANYL CITRATE (PF) 100 MCG/2ML IJ SOLN: 25.0000 ug | INTRAMUSCULAR | Status: DC | PRN

## 2016-06-12 MED ORDER — ONDANSETRON HCL 4 MG/2ML IJ SOLN: 4.0000 mg | Freq: Four times a day (QID) | INTRAMUSCULAR | Status: DC | PRN

## 2016-06-12 MED ORDER — DIPHENHYDRAMINE HCL 12.5 MG/5ML PO ELIX: 12.5000 mg | ORAL_SOLUTION | ORAL | Status: DC | PRN

## 2016-06-12 MED ORDER — ACETAMINOPHEN 10 MG/ML IV SOLN
INTRAVENOUS | Status: AC
  Filled 2016-06-12: qty 100

## 2016-06-12 MED ORDER — EPHEDRINE 5 MG/ML INJ
INTRAVENOUS | Status: AC
  Filled 2016-06-12: qty 10

## 2016-06-12 MED ORDER — TRANEXAMIC ACID 1000 MG/10ML IV SOLN
INTRAVENOUS | Status: AC
  Filled 2016-06-12: qty 10

## 2016-06-12 MED ORDER — PANTOPRAZOLE SODIUM 40 MG PO TBEC
40.0000 mg | DELAYED_RELEASE_TABLET | Freq: Every day | ORAL | Status: DC
  Administered 2016-06-12 – 2016-06-16 (×5): 40 mg via ORAL
  Filled 2016-06-12 (×5): qty 1

## 2016-06-12 MED ORDER — AMLODIPINE BESYLATE 10 MG PO TABS
10.0000 mg | ORAL_TABLET | Freq: Every day | ORAL | Status: DC
  Administered 2016-06-12 – 2016-06-13 (×2): 10 mg via ORAL
  Filled 2016-06-12 (×2): qty 1

## 2016-06-12 MED ORDER — ROCURONIUM BROMIDE 100 MG/10ML IV SOLN
INTRAVENOUS | Status: DC | PRN
  Administered 2016-06-12: 30 mg via INTRAVENOUS

## 2016-06-12 MED ORDER — NEOMYCIN-POLYMYXIN B GU 40-200000 IR SOLN
Status: DC | PRN
  Administered 2016-06-12: 4 mL

## 2016-06-12 MED ORDER — ACETAMINOPHEN 500 MG PO TABS: 1000.0000 mg | ORAL_TABLET | Freq: Four times a day (QID) | ORAL | Status: DC

## 2016-06-12 MED ORDER — BUPIVACAINE-EPINEPHRINE 0.25% -1:200000 IJ SOLN
INTRAMUSCULAR | Status: DC | PRN
  Administered 2016-06-12: 30 mL

## 2016-06-12 MED ORDER — ACETAMINOPHEN 650 MG RE SUPP: 650.0000 mg | Freq: Four times a day (QID) | RECTAL | Status: DC | PRN

## 2016-06-12 MED ORDER — ONDANSETRON HCL 4 MG/2ML IJ SOLN
INTRAMUSCULAR | Status: DC | PRN
  Administered 2016-06-12: 4 mg via INTRAVENOUS

## 2016-06-12 MED ORDER — MORPHINE SULFATE (PF) 4 MG/ML IV SOLN
4.0000 mg | Freq: Once | INTRAVENOUS | Status: DC
  Filled 2016-06-12 (×2): qty 1

## 2016-06-12 MED ORDER — BISACODYL 10 MG RE SUPP: 10.0000 mg | Freq: Every day | RECTAL | Status: DC | PRN

## 2016-06-12 MED ORDER — MORPHINE SULFATE (PF) 4 MG/ML IV SOLN
4.0000 mg | Freq: Once | INTRAVENOUS | Status: AC
  Administered 2016-06-12: 4 mg via INTRAVENOUS

## 2016-06-12 MED ORDER — OXYCODONE HCL 5 MG/5ML PO SOLN: 5.0000 mg | Freq: Once | ORAL | Status: DC | PRN

## 2016-06-12 MED ORDER — CO Q 10 60 MG PO CAPS: 1.0000 | ORAL_CAPSULE | Freq: Every day | ORAL | Status: DC

## 2016-06-12 MED ORDER — SERTRALINE HCL 50 MG PO TABS
25.0000 mg | ORAL_TABLET | Freq: Every day | ORAL | Status: DC
  Administered 2016-06-13 – 2016-06-16 (×4): 25 mg via ORAL
  Filled 2016-06-12 (×4): qty 1

## 2016-06-12 MED ORDER — ONDANSETRON HCL 4 MG PO TABS: 4.0000 mg | ORAL_TABLET | Freq: Four times a day (QID) | ORAL | Status: DC | PRN

## 2016-06-12 MED ORDER — CEFAZOLIN IN D5W 1 GM/50ML IV SOLN
1.0000 g | Freq: Once | INTRAVENOUS | Status: AC
  Administered 2016-06-12: 1 g via INTRAVENOUS
  Filled 2016-06-12 (×2): qty 50

## 2016-06-12 MED ORDER — LACTATED RINGERS IV SOLN
INTRAVENOUS | Status: DC | PRN
  Administered 2016-06-12: 13:00:00 via INTRAVENOUS

## 2016-06-12 MED ORDER — OXYCODONE HCL 5 MG PO TABS
5.0000 mg | ORAL_TABLET | ORAL | Status: DC | PRN
  Administered 2016-06-13 – 2016-06-14 (×3): 5 mg via ORAL
  Filled 2016-06-12 (×4): qty 1

## 2016-06-12 MED ORDER — ACETAMINOPHEN 10 MG/ML IV SOLN
1000.0000 mg | Freq: Four times a day (QID) | INTRAVENOUS | Status: AC
  Administered 2016-06-12 – 2016-06-13 (×3): 1000 mg via INTRAVENOUS
  Filled 2016-06-12 (×4): qty 100

## 2016-06-12 MED ORDER — CEFAZOLIN SODIUM-DEXTROSE 2-4 GM/100ML-% IV SOLN
2.0000 g | Freq: Four times a day (QID) | INTRAVENOUS | Status: AC
  Administered 2016-06-12 – 2016-06-13 (×3): 2 g via INTRAVENOUS
  Filled 2016-06-12 (×3): qty 100

## 2016-06-12 MED ORDER — ACETAMINOPHEN 10 MG/ML IV SOLN
INTRAVENOUS | Status: DC | PRN
  Administered 2016-06-12: 1000 mg via INTRAVENOUS

## 2016-06-12 MED ORDER — SODIUM CHLORIDE 0.9 % IV SOLN
INTRAVENOUS | Status: DC
  Administered 2016-06-12 – 2016-06-13 (×2): via INTRAVENOUS

## 2016-06-12 MED ORDER — SUCCINYLCHOLINE CHLORIDE 20 MG/ML IJ SOLN
INTRAMUSCULAR | Status: DC | PRN
  Administered 2016-06-12: 80 mg via INTRAVENOUS

## 2016-06-12 MED ORDER — TRANEXAMIC ACID 1000 MG/10ML IV SOLN
INTRAVENOUS | Status: DC | PRN
  Administered 2016-06-12: 1000 mg via INTRAVENOUS

## 2016-06-12 MED ORDER — LIDOCAINE HCL (CARDIAC) 20 MG/ML IV SOLN
INTRAVENOUS | Status: DC | PRN
  Administered 2016-06-12: 100 mg via INTRAVENOUS

## 2016-06-12 MED ORDER — MAGNESIUM CITRATE PO SOLN
1.0000 | Freq: Once | ORAL | Status: DC | PRN
  Filled 2016-06-12: qty 296

## 2016-06-12 MED ORDER — SUCCINYLCHOLINE CHLORIDE 200 MG/10ML IV SOSY
PREFILLED_SYRINGE | INTRAVENOUS | Status: AC
  Filled 2016-06-12: qty 10

## 2016-06-12 MED ORDER — LIDOCAINE 2% (20 MG/ML) 5 ML SYRINGE
INTRAMUSCULAR | Status: AC
  Filled 2016-06-12: qty 5

## 2016-06-12 MED ORDER — NEOMYCIN-POLYMYXIN B GU 40-200000 IR SOLN
Status: AC
  Filled 2016-06-12: qty 4

## 2016-06-12 MED ORDER — EPHEDRINE SULFATE 50 MG/ML IJ SOLN
INTRAMUSCULAR | Status: DC | PRN
  Administered 2016-06-12: 5 mg via INTRAVENOUS
  Administered 2016-06-12: 10 mg via INTRAVENOUS

## 2016-06-12 MED ORDER — ASPIRIN EC 81 MG PO TBEC
81.0000 mg | DELAYED_RELEASE_TABLET | Freq: Every day | ORAL | Status: DC
  Administered 2016-06-12 – 2016-06-16 (×5): 81 mg via ORAL
  Filled 2016-06-12 (×6): qty 1

## 2016-06-12 MED ORDER — SUGAMMADEX SODIUM 200 MG/2ML IV SOLN
INTRAVENOUS | Status: AC
  Filled 2016-06-12: qty 2

## 2016-06-12 MED ORDER — FENTANYL CITRATE (PF) 100 MCG/2ML IJ SOLN
INTRAMUSCULAR | Status: AC
  Filled 2016-06-12: qty 2

## 2016-06-12 MED ORDER — OCUVITE-LUTEIN PO CAPS
1.0000 | ORAL_CAPSULE | Freq: Every day | ORAL | Status: DC
  Administered 2016-06-13 – 2016-06-16 (×4): 1 via ORAL
  Filled 2016-06-12 (×4): qty 1

## 2016-06-12 MED ORDER — DONEPEZIL HCL 5 MG PO TABS
10.0000 mg | ORAL_TABLET | Freq: Every day | ORAL | Status: DC
  Administered 2016-06-12 – 2016-06-15 (×4): 10 mg via ORAL
  Filled 2016-06-12 (×4): qty 2

## 2016-06-12 MED ORDER — DOCUSATE SODIUM 100 MG PO CAPS
100.0000 mg | ORAL_CAPSULE | Freq: Two times a day (BID) | ORAL | Status: DC
Start: 2016-06-12 — End: 2016-06-16
  Administered 2016-06-12 – 2016-06-16 (×9): 100 mg via ORAL
  Filled 2016-06-12 (×9): qty 1

## 2016-06-12 MED ORDER — MENTHOL 3 MG MT LOZG
1.0000 | LOZENGE | OROMUCOSAL | Status: DC | PRN
  Filled 2016-06-12: qty 9

## 2016-06-12 MED ORDER — CALCIUM CARBONATE ANTACID 500 MG PO CHEW
1.0000 | CHEWABLE_TABLET | Freq: Every day | ORAL | Status: DC
  Administered 2016-06-13 – 2016-06-16 (×4): 200 mg via ORAL
  Filled 2016-06-12 (×4): qty 1

## 2016-06-12 SURGICAL SUPPLY — 49 items
BLADE SAW SAG 18.5X105 (BLADE) ×2 IMPLANT
BNDG COHESIVE 6X5 TAN STRL LF (GAUZE/BANDAGES/DRESSINGS) ×4 IMPLANT
CANISTER SUCT 1200ML W/VALVE (MISCELLANEOUS) ×2 IMPLANT
CAPT HIP HEMI 2 ×1 IMPLANT
CATH FOL LEG HOLDER (MISCELLANEOUS) ×2 IMPLANT
CATH TRAY METER 16FR LF (MISCELLANEOUS) ×2 IMPLANT
CHLORAPREP W/TINT 26ML (MISCELLANEOUS) ×2 IMPLANT
DRAPE C-ARM XRAY 36X54 (DRAPES) ×2 IMPLANT
DRAPE INCISE IOBAN 66X60 STRL (DRAPES) IMPLANT
DRAPE POUCH INSTRU U-SHP 10X18 (DRAPES) ×2 IMPLANT
DRAPE SHEET LG 3/4 BI-LAMINATE (DRAPES) ×6 IMPLANT
DRAPE STERI IOBAN 125X83 (DRAPES) ×2 IMPLANT
DRAPE TABLE BACK 80X90 (DRAPES) ×2 IMPLANT
DRSG OPSITE POSTOP 4X8 (GAUZE/BANDAGES/DRESSINGS) ×4 IMPLANT
ELECT BLADE 6.5 EXT (BLADE) ×2 IMPLANT
ELECT REM PT RETURN 9FT ADLT (ELECTROSURGICAL) ×2
ELECTRODE REM PT RTRN 9FT ADLT (ELECTROSURGICAL) ×1 IMPLANT
GAUZE SPONGE 4X4 12PLY STRL (GAUZE/BANDAGES/DRESSINGS) ×1 IMPLANT
GLOVE BIOGEL PI IND STRL 9 (GLOVE) ×1 IMPLANT
GLOVE BIOGEL PI INDICATOR 9 (GLOVE) ×1
GLOVE SURG SYN 9.0  PF PI (GLOVE) ×2
GLOVE SURG SYN 9.0 PF PI (GLOVE) ×2 IMPLANT
GOWN SRG 2XL LVL 4 RGLN SLV (GOWNS) ×1 IMPLANT
GOWN STRL NON-REIN 2XL LVL4 (GOWNS) ×2
GOWN STRL REUS W/ TWL LRG LVL3 (GOWN DISPOSABLE) ×1 IMPLANT
GOWN STRL REUS W/TWL LRG LVL3 (GOWN DISPOSABLE) ×2
HEMOVAC 400CC 10FR (MISCELLANEOUS) IMPLANT
HOOD PEEL AWAY FLYTE STAYCOOL (MISCELLANEOUS) ×2 IMPLANT
MAT BLUE FLOOR 46X72 FLO (MISCELLANEOUS) ×2 IMPLANT
NDL SAFETY 18GX1.5 (NEEDLE) ×2 IMPLANT
NDL SPNL 18GX3.5 QUINCKE PK (NEEDLE) ×1 IMPLANT
NEEDLE SPNL 18GX3.5 QUINCKE PK (NEEDLE) ×2 IMPLANT
NS IRRIG 1000ML POUR BTL (IV SOLUTION) ×2 IMPLANT
PACK HIP COMPR (MISCELLANEOUS) ×2 IMPLANT
SOL PREP PVP 2OZ (MISCELLANEOUS) ×2
SOLUTION PREP PVP 2OZ (MISCELLANEOUS) ×1 IMPLANT
STAPLER SKIN PROX 35W (STAPLE) ×2 IMPLANT
STRAP SAFETY BODY (MISCELLANEOUS) ×2 IMPLANT
SUT DVC 2 QUILL PDO  T11 36X36 (SUTURE) ×1
SUT DVC 2 QUILL PDO T11 36X36 (SUTURE) ×1 IMPLANT
SUT SILK 0 (SUTURE) ×2
SUT SILK 0 30XBRD TIE 6 (SUTURE) ×1 IMPLANT
SUT V-LOC 90 ABS DVC 3-0 CL (SUTURE) ×2 IMPLANT
SUT VIC AB 1 CT1 36 (SUTURE) ×2 IMPLANT
SYR 20CC LL (SYRINGE) ×2 IMPLANT
SYR 30ML LL (SYRINGE) ×2 IMPLANT
TAPE MICROFOAM 4IN (TAPE) ×2 IMPLANT
TOWEL OR 17X26 4PK STRL BLUE (TOWEL DISPOSABLE) ×2 IMPLANT
TUBE KAMVAC SUCTION (TUBING) IMPLANT

## 2016-06-12 NOTE — Progress Notes (Signed)
Patient just rolled -in and OR called to get report.[facility].Patient was alert and oriented.No signs of acute distress.

## 2016-06-12 NOTE — Anesthesia Preprocedure Evaluation (Addendum)
Anesthesia Evaluation  Patient identified by MRN, date of birth, ID band Patient confused    Reviewed: Allergy & Precautions, H&P , NPO status , Patient's Chart, lab work & pertinent test results  History of Anesthesia Complications Negative for: history of anesthetic complications  Airway Mallampati: III  TM Distance: <3 FB Neck ROM: limited    Dental no notable dental hx. (+) Poor Dentition, Missing, Edentulous Lower, Edentulous Upper   Pulmonary neg pulmonary ROS, neg shortness of breath,    Pulmonary exam normal breath sounds clear to auscultation       Cardiovascular hypertension, (-) angina(-) Past MI + dysrhythmias Atrial Fibrillation  Rhythm:regular Rate:Normal + Systolic murmurs    Neuro/Psych PSYCHIATRIC DISORDERS negative neurological ROS     GI/Hepatic Neg liver ROS, Controlled,  Endo/Other  negative endocrine ROS  Renal/GU      Musculoskeletal   Abdominal   Peds  Hematology negative hematology ROS (+)   Anesthesia Other Findings Past Medical History: No date: Dementia No date: Hypertension No date: Macular degeneration  Past Surgical History: No date: TOTAL HIP ARTHROPLASTY  BMI    Body Mass Index:  27.25 kg/m      Reproductive/Obstetrics negative OB ROS                             Anesthesia Physical Anesthesia Plan  ASA: III  Anesthesia Plan: General ETT   Post-op Pain Management:    Induction:   Airway Management Planned:   Additional Equipment:   Intra-op Plan:   Post-operative Plan:   Informed Consent: I have reviewed the patients History and Physical, chart, labs and discussed the procedure including the risks, benefits and alternatives for the proposed anesthesia with the patient or authorized representative who has indicated his/her understanding and acceptance.   Dental Advisory Given  Plan Discussed with: Anesthesiologist, CRNA and  Surgeon  Anesthesia Plan Comments: (Consent via Ginny Ward RN-daughter [phone] by phone.  Discussed GETA vs spinal.  Due to the patient cardiac history I feel that GETA would be safer for the patient then spinal.  Daughter voiced understanding.  Patient and family informed that patient is higher risk for complications from anesthesia during this procedure due to their medical history and age including but not limited to post operative cognitive dysfunction.  They voiced understanding. )       Anesthesia Quick Evaluation

## 2016-06-12 NOTE — ED Provider Notes (Signed)
Time Seen: Approximately *0842  I have reviewed the triage notes  Chief Complaint: Fall and Hip Pain   History of Present Illness: [name] is a 82 y.o. female who presents with a non-syncopal fall with left hip pain. Patient was in the kitchen preparing her breakfast and apparently turned and normally uses her walker which she was using and fell. She did strike the back of her head on the refrigerator but denies any loss of consciousness or neck pain. He landed on her left hip and has had difficulty with ambulation. EMS was notified and the patient was transported here uneventfully. She states last time she ate was last night at dinner. He denies any headaches, nausea, vomiting, chest, abdominal pain, spinal discomfort over the thoracic or lumbar spine etc.   Past Medical History:  Diagnosis Date  . Hypertension     There are no active problems to display for this patient.   Past Surgical History:  Procedure Laterality Date  . TOTAL HIP ARTHROPLASTY      Past Surgical History:  Procedure Laterality Date  . TOTAL HIP ARTHROPLASTY        Allergies:  Patient has no known allergies.  Family History: No family history on file.  Social History: Social History  Substance Use Topics  . Smoking status: Never Smoker  . Smokeless tobacco: Never Used  . Alcohol use Not on file     Review of Systems:   10 point review of systems was performed and was otherwise negative:  Constitutional: No fever Eyes: No visual disturbances ENT: No sore throat, ear pain Cardiac: No chest pain Respiratory: No shortness of breath, wheezing, or stridor Abdomen: No abdominal pain, no vomiting, No diarrhea Endocrine: No weight loss, No night sweats Extremities: Pain is exclusively in the left hip region. No left upper extremity pain. No knee or ankle discomfort. Skin: No rashes, easy bruising Neurologic: No focal weakness, trouble with speech or swollowing Urologic: No dysuria,  Hematuria, or urinary frequency  Physical Exam:  ED Triage Vitals  Enc Vitals Group     BP 06/12/16 0836 (!) 173/61     Pulse Rate 06/12/16 0836 62     Resp 06/12/16 0836 18     Temp 06/12/16 0836 98.6 F (37 C)     Temp Source 06/12/16 0836 Oral     SpO2 06/12/16 0834 96 %     Weight 06/12/16 0836 149 lb (67.6 kg)     Height 06/12/16 0836 5\' 2"  (1.575 m)     Head Circumference --      Peak Flow --      Pain Score --      Pain Loc --      Pain Edu? --      Excl. in GC? --     General: Awake , Alert , and Oriented times 3; GCS 15 Head: Normal cephalic , atraumatic Eyes: Pupils equal , round, reactive to light Nose/Throat: No nasal drainage, patent upper airway without erythema or exudate.  Neck: Supple, Full range of motion, No anterior adenopathy or palpable thyroid masses Lungs: Clear to ascultation without wheezes , rhonchi, or rales Heart: Regular rate, regular rhythm without murmurs , gallops , or rubs Abdomen: Soft, non tender without rebound, guarding , or rigidity; bowel sounds positive and symmetric in all 4 quadrants. No organomegaly .        Extremities: Left lower extremity is rotated and slightly shortened compared to the right. Otherwise is neurovascularly  intact. Tenderness with flexion of the hip. Neurologic: normal ambulation, Motor symmetric without deficits, sensory intact Skin: warm, dry, no rashes   Labs:   All laboratory work was reviewed including any pertinent negatives or positives listed below:  Labs Reviewed  CBC WITH DIFFERENTIAL/PLATELET  COMPREHENSIVE METABOLIC PANEL    EKG:  ED ECG REPORT I, Jennye MoccasinBrian S Quigley, the attending physician, personally viewed and interpreted this ECG.  Date: 06/12/2016 EKG Time: 0836 Rate: 55 Rhythm: normal sinus rhythm QRS Axis: normal Intervals: Left bundle branch block ST/T Wave abnormalities: normal Conduction Disturbances: none Narrative Interpretation: unremarkable No acute ischemic  changes   Radiology: * "Dg Chest Port 1 View  Result Date: 06/12/2016 CLINICAL DATA:  Tripped and fall this morning. Left hip pain. Initial encounter. EXAM: PORTABLE CHEST 1 VIEW COMPARISON:  07/31/2013 FINDINGS: Cardiac silhouette remains mildly enlarged, unchanged. Ectatic appearance of the thoracic aorta is similar to the prior study with atherosclerotic calcification again seen. There is mild biapical scarring with calcification on the right. There is no evidence of acute airspace consolidation, edema, pleural effusion, or pneumothorax. No acute osseous abnormality is identified. IMPRESSION: No active disease. Electronically Signed   By: Sebastian AcheAllen  Grady M.D.   On: 06/12/2016 09:20   Dg Hip Unilat With Pelvis 2-3 Views Left  Result Date: 06/12/2016 CLINICAL DATA:  Initial encounter for Pt arrives via EMS from home, pt fell and tripped in her kitchen this AM and is complaining of left hip pain, EMS gave 50 mcg of fentayl, denies any LOC, EDP at bedside, uses walker at home EXAM: DG HIP (WITH OR WITHOUT PELVIS) 2-3V LEFT COMPARISON:  None. FINDINGS: Right hip arthroplasty. Osteopenia. Sacroiliac joints are symmetric. Displaced fracture of the mid left cervical neck with comminution and lateral displacement of distal fracture fragment. Vascular calcifications. IMPRESSION: Left femoral neck fracture. Electronically Signed   By: Jeronimo GreavesKyle  Talbot M.D.   On: 06/12/2016 09:23  "  I personally reviewed the radiologic studies     ED Course:  Patient's stay here was uneventful and it appears that she suffered a left femoral neck fracture. I discussed case with the flu with orthopedics unassigned Dr. Rosita KeaMenz. He is agreed to see and evaluate the patient later today. Patient will be admitted to the hospitalist program for pain control, etc. Clinical Course      Assessment: * Non-syncopal fall with left femoral neck fracture   Final Clinical Impression:  Final diagnoses:  Trauma     Plan:   Inpatient            Jennye MoccasinBrian S Quigley, MD 06/12/16 1002

## 2016-06-12 NOTE — Care Management Note (Signed)
Case Management Note  Patient Details  Name: [name] MRN: [id number] Date of Birth: 1923-10-24  Subjective/Objective:                  RNCM consult received and will continue to follow. Patient is from home where she was living independently. She depends on her daughter [name] for transportation to appointments. PCP is Dr. Terance HartBronstein. She has rolling walker, bedside commode, raised toilet seat, and wheelchair in storage. [name]. Patient is from home alone and she wants to go to [facility] at discharge. She would like home health through [facility] when she is able to return home. Per [name] "her dementia has gotten worse and so has her balance is worse too- I'm really not surprise this fall happened". This is her first fall in last two years ago. She used [facility] in the past  Action/Plan: Referral to [facility].   Expected Discharge Date:                  Expected Discharge Plan:     In-House Referral:     Discharge planning Services  CM Consult  Post Acute Care Choice:  Durable Medical Equipment, Home Health Choice offered to:  Adult Children (Maria Ward RN-[phone])  DME Arranged:    DME Agency:     HH Arranged:    HH Agency:     Status of Service:  In process, will continue to follow  If discussed at Long Length of Stay Meetings, dates discussed:    Additional Comments:  Maria Siadngela Arlicia Paquette, RN 06/12/2016, 12:07 PM

## 2016-06-12 NOTE — Progress Notes (Signed)
Family Meeting Note  Advance Directive:yes  Today a meeting took place with the Patient and daughter.  The following clinical team members were present during this meeting:MD  The following were discussed:Patient's diagnosis: dementia, old age, hip fracture , Patient's progosis: Unable to determine and Goals for treatment: DNR  Additional follow-up to be provided: orthopedic consult.  Time spent during discussion:20 minutes  Fran Mcree, Heath GoldVAIBHAVKUMAR, MD

## 2016-06-12 NOTE — ED Triage Notes (Signed)
Pt arrives via EMS from home, pt fell and tripped in her kitchen this AM and is complaining of left hip pain, EMS gave 50 mcg of fentayl, denies any LOC, EDP at bedside, uses walker at home

## 2016-06-12 NOTE — H&P (Signed)
[facility] - [place] [facility]   PATIENT NAME: [name]    MR#:  [id number]  DATE OF BIRTH:  06-03-1924  DATE OF ADMISSION:  06/12/2016  PRIMARY CARE PHYSICIAN: No PCP Per Patient   REQUESTING/REFERRING PHYSICIAN: Huel CoteQuigley  CHIEF COMPLAINT:   Chief Complaint  Patient presents with  . Fall  . Hip Pain    HISTORY OF PRESENT ILLNESS: Maria Ewingsolly Stehle  is a 82 y.o. 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 ER physician spoke to orthopedic Dr.Menz , and called hospitalist team for admission. Patient denies any other complaints. Her daughter is also present in the ER and she is the major source of history.  PAST MEDICAL HISTORY:   Past Medical History:  Diagnosis Date  . Dementia   . Hypertension   . Macular degeneration     PAST SURGICAL HISTORY: Past Surgical History:  Procedure Laterality Date  . TOTAL HIP ARTHROPLASTY      SOCIAL HISTORY:  Social History  Substance Use Topics  . Smoking status: Never Smoker  . Smokeless tobacco: Never Used  . Alcohol use Not on file    FAMILY HISTORY:  Family History  Problem Relation Age of Onset  . Stroke Mother     DRUG ALLERGIES: No Known Allergies  REVIEW OF SYSTEMS:   CONSTITUTIONAL: No fever, fatigue or weakness.  EYES: No blurred or double vision.  EARS, NOSE, AND THROAT: No tinnitus or ear pain.  RESPIRATORY: No cough, shortness of breath, wheezing or hemoptysis.  CARDIOVASCULAR: No chest pain, orthopnea, edema.  GASTROINTESTINAL: No nausea, vomiting, diarrhea or abdominal pain.  GENITOURINARY: No dysuria, hematuria.  ENDOCRINE: No polyuria, nocturia,  HEMATOLOGY: No anemia, easy bruising or bleeding SKIN: No rash or lesion. MUSCULOSKELETAL: No joint pain or arthritis.   NEUROLOGIC: No tingling, numbness,  weakness.  PSYCHIATRY: No anxiety or depression.   MEDICATIONS AT HOME:  Prior to Admission medications   Medication Sig Start Date End Date Taking? Authorizing Provider  aspirin EC 81 MG tablet Take 81 mg by mouth daily.   Yes Historical Provider, MD  Coenzyme Q10 (CO Q 10) 60 MG CAPS Take 1 capsule by mouth daily.   Yes Historical Provider, MD  donepezil (ARICEPT) 10 MG tablet Take 1 tablet by mouth at bedtime. 11/17/15  Yes Historical Provider, MD  metoprolol (LOPRESSOR) 50 MG tablet Take 1 tablet by mouth 2 (two) times daily. 05/10/16  Yes Historical Provider, MD  Multiple Vitamins-Minerals (OCUVITE ADULT 50+) CAPS Take 1 capsule by mouth daily.   Yes Historical Provider, MD  sertraline (ZOLOFT) 25 MG tablet Take 25 mg by mouth daily. 05/22/16  Yes Historical Provider, MD  vitamin B-12 (CYANOCOBALAMIN) 1000 MCG tablet Take 1 tablet by mouth daily.   Yes Historical Provider, MD  Calcium Carbonate Antacid (TUMS PO) Take 1 tablet by mouth daily.    Historical Provider, MD  omeprazole (PRILOSEC) 20 MG capsule Take 20 mg by mouth daily. 06/08/16   Historical Provider, MD      PHYSICAL EXAMINATION:   VITAL SIGNS: Blood pressure (!) 172/68, pulse (!) 56, temperature 98.6 F (37 C), temperature source Oral, resp. rate 16, height 5\' 2"  (1.575 m), weight 67.6 kg (149 lb), SpO2 95 %.  GENERAL:  82 y.o.-year-old patient lying in the bed with  no acute distress.  EYES: Pupils equal, round, reactive to light and accommodation. No scleral icterus. Extraocular muscles intact.  HEENT: Head atraumatic, normocephalic. Oropharynx and nasopharynx clear.  NECK:  Supple, no jugular venous distention. No thyroid enlargement, no tenderness.  LUNGS: Normal breath sounds bilaterally, no wheezing, rales,rhonchi or crepitation. No use of accessory muscles of respiration.  CARDIOVASCULAR: S1, S2 normal. Systolic murmurs, no rubs, or gallops.  ABDOMEN: Soft, nontender, nondistended. Bowel sounds present. No  organomegaly or mass.  EXTREMITIES: No pedal edema, cyanosis, or clubbing.  NEUROLOGIC: Cranial nerves II through XII are intact. Muscle strength 5/5 in all extremities, so pain in left lower extremity so decreased movement. Sensation intact. Gait not checked.  PSYCHIATRIC: The patient is alert and oriented x 3.  SKIN: No obvious rash, lesion, or ulcer.   LABORATORY PANEL:   CBC  Recent Labs Lab 06/12/16 0838  WBC 6.4  HGB 12.4  HCT 36.5  PLT 258  MCV 79.9*  MCH 27.2  MCHC 34.1  RDW 13.6  LYMPHSABS 1.9  MONOABS 0.7  EOSABS 0.1  BASOSABS 0.1   ------------------------------------------------------------------------------------------------------------------  Chemistries   Recent Labs Lab 06/12/16 0838  NA 132*  K 4.1  CL 99*  CO2 29  GLUCOSE 101*  BUN 15  CREATININE 0.62  CALCIUM 9.1  AST 18  ALT 16  ALKPHOS 74  BILITOT 0.6   ------------------------------------------------------------------------------------------------------------------ estimated creatinine clearance is 42.1 mL/min (by C-G formula based on SCr of 0.62 mg/dL). ------------------------------------------------------------------------------------------------------------------ No results for input(s): TSH, T4TOTAL, T3FREE, THYROIDAB in the last 72 hours.  Invalid input(s): FREET3   Coagulation profile No results for input(s): INR, PROTIME in the last 168 hours. ------------------------------------------------------------------------------------------------------------------- No results for input(s): DDIMER in the last 72 hours. -------------------------------------------------------------------------------------------------------------------  Cardiac Enzymes No results for input(s): CKMB, TROPONINI, MYOGLOBIN in the last 168 hours.  Invalid input(s): CK ------------------------------------------------------------------------------------------------------------------ Invalid input(s):  POCBNP  ---------------------------------------------------------------------------------------------------------------  Urinalysis    Component Value Date/Time   COLORURINE Straw 07/31/2013 2019   APPEARANCEUR Clear 07/31/2013 2019   LABSPEC 1.008 07/31/2013 2019   PHURINE 7.0 07/31/2013 2019   GLUCOSEU Negative 07/31/2013 2019   HGBUR Negative 07/31/2013 2019   BILIRUBINUR Negative 07/31/2013 2019   KETONESUR Negative 07/31/2013 2019   PROTEINUR Negative 07/31/2013 2019   NITRITE Negative 07/31/2013 2019   LEUKOCYTESUR Negative 07/31/2013 2019     RADIOLOGY: Dg Chest Port 1 View  Result Date: 06/12/2016 CLINICAL DATA:  Tripped and fall this morning. Left hip pain. Initial encounter. EXAM: PORTABLE CHEST 1 VIEW COMPARISON:  07/31/2013 FINDINGS: Cardiac silhouette remains mildly enlarged, unchanged. Ectatic appearance of the thoracic aorta is similar to the prior study with atherosclerotic calcification again seen. There is mild biapical scarring with calcification on the right. There is no evidence of acute airspace consolidation, edema, pleural effusion, or pneumothorax. No acute osseous abnormality is identified. IMPRESSION: No active disease. Electronically Signed   By: Sebastian AcheAllen  Grady M.D.   On: 06/12/2016 09:20   Dg Hip Unilat With Pelvis 2-3 Views Left  Result Date: 06/12/2016 CLINICAL DATA:  Initial encounter for Pt arrives via EMS from home, pt fell and tripped in her kitchen this AM and is complaining of left hip pain, EMS gave 50 mcg of fentayl, denies any LOC, EDP at bedside, uses walker at home EXAM: DG HIP (WITH OR WITHOUT PELVIS) 2-3V LEFT COMPARISON:  None. FINDINGS: Right hip arthroplasty. Osteopenia. Sacroiliac joints are symmetric. Displaced fracture of the mid left cervical neck with comminution and lateral displacement of distal fracture fragment.  Vascular calcifications. IMPRESSION: Left femoral neck fracture. Electronically Signed   By: Jeronimo Greaves M.D.   On:  06/12/2016 09:23    EKG: Orders placed or performed during the hospital encounter of 06/12/16  . EKG 12-Lead  . EKG 12-Lead    IMPRESSION AND PLAN:  * Left hip fracture   Orthopedic consult.   She does not have any cardiac history. He was very active at home and so having surgery for her hip will help her to regain her independence. So we can proceed with surgery, which is low risk from cardiac and respiratory standpoint.   I will not start on DVT prophylaxis at this point and that can be addressed by orthopedic surgeon.   Morphine IV as needed for pain control.   She will need physical therapy and evaluation for placement to rehabilitation after surgery.  * Hypertension   She has some bradycardia on telemetry monitoring so I will stop metoprolol and start on amlodipine.  * Dementia   Continue Aricept.   All the records are reviewed and case discussed with ED provider. Management plans discussed with the patient, family and they are in agreement.  CODE STATUS: DO NOT RESUSCITATE Code Status History    This patient does not have a recorded code status. Please follow your organizational policy for patients in this situation.    Advance Directive Documentation   Flowsheet Row Most Recent Value  Type of Advance Directive  Out of facility DNR (pink MOST or yellow form)  Pre-existing out of facility DNR order (yellow form or pink MOST form)  Yellow form placed in chart (order not valid for inpatient use)  "MOST" Form in Place?  No data     Patient's son and daughter were present in the room.  TOTAL TIME TAKING CARE OF THIS PATIENT: 50 minutes.    Altamese Dilling M.D on 06/12/2016   Between 7am to 6pm - Pager - [phone]  After 6pm go to [email or web address] - Social research officer, government  [facility]  Office  [phone]  CC: Primary care physician; No PCP Per Patient   Note: This dictation was prepared with Dragon dictation along with smaller phrase  technology. Any transcriptional errors that result from this process are unintentional.

## 2016-06-12 NOTE — ED Notes (Signed)
Daughter at bedside.

## 2016-06-12 NOTE — Anesthesia Procedure Notes (Signed)
Procedure Name: Intubation Performed by: [name], [name] Pre-anesthesia Checklist: Patient identified, Emergency Drugs available, Suction available and Patient being monitored Patient Re-evaluated:Patient Re-evaluated prior to inductionOxygen Delivery Method: Circle system utilized Preoxygenation: Pre-oxygenation with 100% oxygen Intubation Type: IV induction Ventilation: Mask ventilation without difficulty Laryngoscope Size: Mac and 3 Grade View: Grade I Tube type: Oral Tube size: 7.0 mm Number of attempts: 1 Airway Equipment and Method: Stylet Placement Confirmation: ETT inserted through vocal cords under direct vision,  positive ETCO2 and breath sounds checked- equal and bilateral Secured at: 20 cm Tube secured with: Tape Dental Injury: Teeth and Oropharynx as per pre-operative assessment

## 2016-06-12 NOTE — Progress Notes (Signed)
PHARMACIST - PHYSICIAN ORDER COMMUNICATION  CONCERNING: P&T Medication Policy on Herbal Medications  DESCRIPTION:  This patient's order for:  CQ 10  has been noted.  This product(s) is classified as an "herbal" or natural product. Due to a lack of definitive safety studies or FDA approval, nonstandard manufacturing practices, plus the potential risk of unknown drug-drug interactions while on inpatient medications, the Pharmacy and Therapeutics Committee does not permit the use of "herbal" or natural products of this type within [facility].   ACTION TAKEN: The pharmacy department is unable to verify this order at this time and your patient has been informed of this safety policy. Please reevaluate patient's clinical condition at discharge and address if the herbal or natural product(s) should be resumed at that time.   Bari MantisKristin Samika Vetsch PharmD Clinical Pharmacist 06/12/2016

## 2016-06-12 NOTE — Op Note (Signed)
06/12/2016  2:39 PM  PATIENT:  Maria DistancePolly G Jordan  82 y.o. female  PRE-OPERATIVE DIAGNOSIS:  left hip fracture  POST-OPERATIVE DIAGNOSIS:  left displaced femoral neck fracture  PROCEDURE:  Procedure(s): left hip hemiarthroplasty anterior approach (Left)  SURGEON: Leitha SchullerMichael J Mikiala Fugett, MD  ASSISTANTS: None  ANESTHESIA:   general  EBL:  Total I/O In: -  Out: 1100 [Urine:600; Blood:500]  BLOOD ADMINISTERED:none  DRAINS: none   LOCAL MEDICATIONS USED:  MARCAINE     SPECIMEN:  Source of Specimen:  Left femoral head  DISPOSITION OF SPECIMEN:  PATHOLOGY  COUNTS:  YES  TOURNIQUET:  * No tourniquets in log *  IMPLANTS: Medacta amis stem size 1 with S 28 mm head and 45 mm bipolar head  DICTATION: .Dragon Dictation   The patient was brought to the operating room and after general anesthesia was obtained patient was placed on the operative table with the ipsilateral foot into the Medacta attachment, contralateral leg on a well-padded table. C-arm was brought in and preop template x-ray taken. After prepping and draping in usual sterile fashion appropriate patient identification and timeout procedures were completed. Anterior approach to the hip was obtained and centered over the greater trochanter and TFL muscle. The subcutaneous tissue was incised hemostasis being achieved by electrocautery. TFL fascia was incised and the muscle retracted laterally deep retractor placed. The lateral femoral circumflex vessels were identified and ligated. The anterior capsule was exposed and a capsulotomy performed. The neck was identified and a femoral neck cut carried out with a saw. The subcapital fracture was exposed after removal of the lower portion of the neck and the head was removed without difficulty with mild degenerative change present a 45 Mill meter trial fit well The leg was then externally rotated and ischiofemoral and patellofemoral releases carried out. The femur was sequentially broached to a size  1, size 1 standard trials were placed and the final components chosen. The 1 standard stem was inserted along with a S 28 mm head and 45 mm bipolar head. The hip was reduced and was stable the wound was thoroughly irrigated. The deep fascia was closed using a heavy Quill after infiltration of 30 cc of quarter percent Sensorcaine with epinephrine. TXA was infiltrated into the joint after wound closure 3-0 v-LOC to close the skin with skin staples. Xeroform and honeycomb dressing applied  PLAN OF CARE: Continue his inpatient

## 2016-06-12 NOTE — Anesthesia Postprocedure Evaluation (Signed)
Anesthesia Post Note  Patient: [name]  Procedure(s) Performed: Procedure(s) (LRB): left hip hemiarthroplasty anterior approach (Left)  Patient location during evaluation: PACU Anesthesia Type: General Level of consciousness: awake and alert Pain management: pain level controlled Vital Signs Assessment: post-procedure vital signs reviewed and stable Respiratory status: spontaneous breathing, nonlabored ventilation, respiratory function stable and patient connected to nasal cannula oxygen Cardiovascular status: blood pressure returned to baseline and stable Postop Assessment: no signs of nausea or vomiting Anesthetic complications: no     Last Vitals:  Vitals:   06/12/16 1522 06/12/16 1542  BP:  (!) 134/54  Pulse:  77  Resp:  18  Temp: 36.6 C 36.7 C    Last Pain:  Vitals:   06/12/16 1542  TempSrc: Oral  PainSc:                  [name]

## 2016-06-12 NOTE — Transfer of Care (Signed)
Immediate Anesthesia Transfer of Care Note  Patient: [name]  Procedure(s) Performed: Procedure(s): left hip hemiarthroplasty anterior approach (Left)  Patient Location: PACU  Anesthesia Type:General  Level of Consciousness: sedated  Airway & Oxygen Therapy: Patient connected to face mask oxygen  Post-op Assessment: Post -op Vital signs reviewed and stable  Post vital signs: stable  Last Vitals:  Vitals:   06/12/16 1246 06/12/16 1433  BP: (!) 189/87 (!) 145/59  Pulse: 67 77  Resp: 20 (!) 22  Temp: 36.5 C 36.7 C    Last Pain:  Vitals:   06/12/16 1433  TempSrc: Temporal  PainSc:          Complications: No apparent anesthesia complications

## 2016-06-13 ENCOUNTER — Encounter: Payer: Self-pay | Admitting: Orthopedic Surgery

## 2016-06-13 LAB — CBC
HEMATOCRIT: 31.6 % — AB (ref 35.0–47.0)
HEMOGLOBIN: 10.4 g/dL — AB (ref 12.0–16.0)
MCH: 26.5 pg (ref 26.0–34.0)
MCHC: 33.1 g/dL (ref 32.0–36.0)
MCV: 80.1 fL (ref 80.0–100.0)
Platelets: 222 10*3/uL (ref 150–440)
RBC: 3.94 MIL/uL (ref 3.80–5.20)
RDW: 13.8 % (ref 11.5–14.5)
WBC: 12.3 10*3/uL — ABNORMAL HIGH (ref 3.6–11.0)

## 2016-06-13 LAB — BASIC METABOLIC PANEL
ANION GAP: 4 — AB (ref 5–15)
BUN: 12 mg/dL (ref 6–20)
CHLORIDE: 99 mmol/L — AB (ref 101–111)
CO2: 27 mmol/L (ref 22–32)
Calcium: 8.1 mg/dL — ABNORMAL LOW (ref 8.9–10.3)
Creatinine, Ser: 0.64 mg/dL (ref 0.44–1.00)
GFR calc Af Amer: >60 mL/min (ref >60)
GFR calc non Af Amer: >60 mL/min (ref >60)
GLUCOSE: 113 mg/dL — AB (ref 65–99)
POTASSIUM: 3.7 mmol/L (ref 3.5–5.1)
Sodium: 130 mmol/L — ABNORMAL LOW (ref 135–145)

## 2016-06-13 NOTE — Progress Notes (Signed)
  Subjective: 1 Day Post-Op Procedure(s) (LRB): left hip hemiarthroplasty anterior approach (Left) Patient reports pain as mild.   Patient is well, and has had no acute complaints or problems PT to work with patient, care management to assist with discharge. Negative for chest pain and shortness of breath Fever: no Gastrointestinal:Negative for nausea and vomiting  Objective: Vital signs in last 24 hours: Temp:  [97.4 F (36.3 C)-98.9 F (37.2 C)] 98.9 F (37.2 C) (12/26 0513) Pulse Rate:  [55-85] 76 (12/26 0513) Resp:  [14-22] 19 (12/26 0513) BP: (108-189)/(39-87) 122/58 (12/26 0513) SpO2:  [90 %-100 %] 93 % (12/26 0513) Weight:  [67.6 kg (149 lb)] 67.6 kg (149 lb) (12/25 0836)  Intake/Output from previous day:  Intake/Output Summary (Last 24 hours) at 06/13/16 0716 Last data filed at 06/13/16 0641  Gross per 24 hour  Intake          1861.67 ml  Output             2100 ml  Net          -238.33 ml    Intake/Output this shift: No intake/output data recorded.  Labs:  Recent Labs  06/12/16 0838 06/13/16 0353  HGB 12.4 10.4*    Recent Labs  06/12/16 0838 06/13/16 0353  WBC 6.4 12.3*  RBC 4.57 3.94  HCT 36.5 31.6*  PLT 258 222    Recent Labs  06/12/16 0838 06/13/16 0353  NA 132* 130*  K 4.1 3.7  CL 99* 99*  CO2 29 27  BUN 15 12  CREATININE 0.62 0.64  GLUCOSE 101* 113*  CALCIUM 9.1 8.1*   No results for input(s): LABPT, INR in the last 72 hours.   EXAM General - Patient is Alert, Appropriate and Oriented Extremity - ABD soft Sensation intact distally Intact pulses distally Dorsiflexion/Plantar flexion intact Incision: scant drainage No cellulitis present Dressing/Incision - blood tinged drainage, minimal at the distal aspect of the incision. Motor Function - intact, moving foot and toes well on exam.  Abd soft, non-tender with normal BS.  Past Medical History:  Diagnosis Date  . Dementia   . Hypertension   . Macular degeneration     Assessment/Plan: 1 Day Post-Op Procedure(s) (LRB): left hip hemiarthroplasty anterior approach (Left) Principal Problem:   Hip fracture (HCC)  Estimated body mass index is 27.25 kg/m as calculated from the following:   Height as of this encounter: 5\' 2"  (1.575 m).   Weight as of this encounter: 67.6 kg (149 lb). Advance diet Up with therapy   Labs reviewed, WBC 12.3, no temp or tachycardia, likely stress-induced. Na 130 this AM, BP 122/58, will continue gentle hydration today while the patient increased her PO diet. Pt is passing gas but denies BM, no Abd pain. Up with therapy today.  DVT Prophylaxis - Lovenox, Foot Pumps and TED hose Weight-Bearing as tolerated to left leg  J. Horris LatinoLance Markeith Jue, PA-C [facility] 06/13/2016, 7:16 AM

## 2016-06-13 NOTE — Plan of Care (Signed)
Problem: Pain Management: Goal: Pain level will decrease Outcome: Progressing No voiced complaints of pain.  Repositioned without distress.

## 2016-06-13 NOTE — Clinical Social Work Note (Signed)
Clinical Social Work Assessment  Patient Details  Name: [name] MRN: [id number] Date of Birth: Nov 20, 1923  Date of referral:  06/13/16               Reason for consult:  Facility Placement                Permission sought to share information with:  Chartered certified accountant granted to share information::  Yes, Verbal Permission Granted  Name::      [place]::   [place]   Relationship::     Contact Information:     Housing/Transportation Living arrangements for the past 2 months:  [place], [place] of Information:  Adult Children Patient Interpreter Needed:  None Criminal Activity/Legal Involvement Pertinent to Current Situation/Hospitalization:  No - Comment as needed Significant Relationships:  Adult Children Lives with:  Self Do you feel safe going back to the place where you live?  Yes Need for family participation in patient care:  Yes (Comment)  Care giving concerns:  Patient lives alone in an apartment in [place].     Social Worker assessment / plan:  Holiday representative (CSW) received SNF consult. PT is recommending SNF. CSW met with patient's daughter Sonia Baller to complete assessment. Sonia Baller worked for Estate [employer] for many years and is familiar with the discharge planning process. Sonia Baller reported that patient lives alone in [place] and she would like her to go to rehab at [facility]. FL2 complete and faxed out.   CSW presented bed offers to daughter, she chose [facility] admissions coordinator at [facility] is aware of accepted bed offer. [facility] Team case manager is aware of above. CSW will continue to follow and assist as needed.    Employment status:  Retired Nurse, adult PT Recommendations:  [place] / Referral to community resources:  [place]  Patient/Family's Response to care:  Patient's  daughter is agreeable for patient to go to [facility].   Patient/Family's Understanding of and Emotional Response to Diagnosis, Current Treatment, and Prognosis:  Patient's daughter was very pleasant and thanked CSW for assistance.   Emotional Assessment Appearance:  Appears stated age Attitude/Demeanor/Rapport:    Affect (typically observed):  Pleasant Orientation:  Oriented to Self, Oriented to Place, Oriented to  Time, Oriented to Situation Alcohol / Substance use:  Not Applicable Psych involvement (Current and /or in the community):  No (Comment)  Discharge Needs  Concerns to be addressed:  Discharge Planning Concerns Readmission within the last 30 days:  No Current discharge risk:  Dependent with Mobility Barriers to Discharge:  Continued Medical Work up   [employer], Veronia Beets, LCSW 06/13/2016, 10:57 AM

## 2016-06-13 NOTE — Evaluation (Signed)
Occupational Therapy Evaluation Patient Details Name: Michaelyn Barterolly G Gloss MRN: [id number] DOB: [birth year] Today's Date: 06/13/2016    History of Present Illness Pt. is a 82 y.o. female who [facility] for a Left Anterior Approach Hemiarthroplasty repair of a left hip fracture.   Clinical Impression   Pt. Is a 82 y.o. Female who was [facility] for a left anterior approach hemiarthroplasty repair of a left hip fracture. Pt. Presents with weakness, pain, decreased functional mobility, and limited cognition which hinder her ability to complete ADLs/IADLs. Pt. Could benefit from skilled OT services for ADL training, A/E training, UE therapeutic. Ex., therapeutic Activities, and pt./family education about home modifications, and DME. Pt. could benefit from follow-up OT services at SNF level of care.    Follow Up Recommendations  SNF    Equipment Recommendations       Recommendations for Other Services       Precautions / Restrictions Precautions Precautions: Anterior Hip Restrictions Weight Bearing Restrictions: Yes LLE Weight Bearing: Weight bearing as tolerated                                                     ADL Overall ADL's : Needs assistance/impaired Eating/Feeding: Set up   Grooming: Set up               Lower Body Dressing: Moderate assistance                 General ADL Comments: Pt. education was provided about A/E use for LE ADLs.      Vision     Perception     Praxis      Pertinent Vitals/Pain Pain Assessment: 0-10 Pain Score: 2  Pain Location: Left Hip     Hand Dominance Right   Extremity/Trunk Assessment Upper Extremity Assessment Upper Extremity Assessment: Overall WFL for tasks assessed           Communication Communication Communication: No difficulties   Cognition Arousal/Alertness: Awake/alert Behavior During Therapy: WFL for tasks assessed/performed Overall Cognitive Status: History  of cognitive impairments - at baseline                     General Comments       Exercises       Shoulder Instructions      Home Living Family/patient expects to be discharged to:: Private residence Living Arrangements: Alone Available Help at Discharge: Family Type of Home: Independent living facility Home Access: Level entry;Elevator     Home Layout: One level     Bathroom Shower/Tub: Tub/shower unit;Curtain Shower/tub characteristics: Engineer, building servicesCurtain Bathroom Toilet: Standard Bathroom Accessibility: Yes   Home Equipment: Environmental consultantWalker - 4 wheels          Prior Functioning/Environment Level of Independence: Independent    ADL's / Homemaking Assistance Needed: Independent with ADLs, laundry, walked to a common dining area for meals. No driving            OT Problem List: Decreased strength;Pain;Decreased range of motion;Decreased knowledge of use of DME or AE   OT Treatment/Interventions: Self-care/ADL training;Therapeutic exercise;Therapeutic activities;Energy conservation;Patient/family education;DME and/or AE instruction    OT Goals(Current goals can be found in the care plan section)    OT Frequency: Min 1X/week   Barriers to D/C:  Co-evaluation              End of Session    Activity Tolerance: Patient tolerated treatment well Patient left: in chair;with call bell/phone within reach;with chair alarm set   Time:  9:45- 10:05, 10:50-11:10  Total: 40 min    Charges:  OT General Charges $OT Visit: 1 Procedure OT Evaluation $OT Eval Moderate Complexity: 1 Procedure OT Treatments $Self Care/Home Management : 8-22 mins G-Codes:    Olegario MessierElaine Timaya Bojarski, MS, OTR/L 06/13/2016, 11:28 AM

## 2016-06-13 NOTE — [place] FL2 (Signed)
[place] MEDICAID FL2 LEVEL OF CARE SCREENING TOOL     IDENTIFICATION  Patient Name: [name] Birthdate: Aug 16, 1923 Sex: female Admission Date (Current Location): 06/12/2016  [place] and [place] Number:  Chiropodist and Address:  [facility], [address], [place], [place] [address]      Provider Number: [id number]  Attending Physician Name and Address:  Alford Highland, MD  Relative Name and Phone Number:       Current Level of Care: Hospital Recommended Level of Care: Skilled Nursing Facility Prior Approval Number:    Date Approved/Denied:   PASRR Number:  [id number])  Discharge Plan: SNF    Current Diagnoses: Patient Active Problem List   Diagnosis Date Noted  . Hip fracture (HCC) 06/12/2016    Orientation RESPIRATION BLADDER Height & Weight     Self, Time, Situation, Place  Normal Continent Weight: 149 lb (67.6 kg) Height:   (157.5 cm)  BEHAVIORAL SYMPTOMS/MOOD NEUROLOGICAL BOWEL NUTRITION STATUS   (none)  (none) Continent Diet (Regular Diet )  AMBULATORY STATUS COMMUNICATION OF NEEDS Skin   Extensive Assist Verbally Surgical wounds (Incision: Left Hip. )                       Personal Care Assistance Level of Assistance  Bathing, Feeding, Dressing Bathing Assistance: Limited assistance Feeding assistance: Independent Dressing Assistance: Limited assistance     Functional Limitations Info  Sight, Hearing, Speech Sight Info: Adequate Hearing Info: Impaired Speech Info: Adequate    SPECIAL CARE FACTORS FREQUENCY  PT (By licensed PT), OT (By licensed OT)     PT Frequency:  (5) OT Frequency:  (5)            Contractures      Additional Factors Info  Code Status, Allergies Code Status Info:  (DNR ) Allergies Info:  (No Known Allergies. )           Current Medications (06/13/2016):  This is the current hospital active medication list Current Facility-Administered Medications   Medication Dose Route Frequency Provider Last Rate Last Dose  . acetaminophen (OFIRMEV) IV 1,000 mg  1,000 mg Intravenous Q6H Kennedy Bucker, MD   1,000 mg at 06/13/16 0514  . acetaminophen (TYLENOL) tablet 650 mg  650 mg Oral Q6H PRN Kennedy Bucker, MD       Or  . acetaminophen (TYLENOL) suppository 650 mg  650 mg Rectal Q6H PRN Kennedy Bucker, MD      . acetaminophen (TYLENOL) tablet 1,000 mg  1,000 mg Oral Q6H Kennedy Bucker, MD      . alum & mag hydroxide-simeth (MAALOX/MYLANTA) 200-200-20 MG/5ML suspension 30 mL  30 mL Oral Q4H PRN Kennedy Bucker, MD      . amLODipine (NORVASC) tablet 10 mg  10 mg Oral Daily Altamese Dilling, MD   10 mg at 06/13/16 5621  . aspirin EC tablet 81 mg  81 mg Oral Daily Altamese Dilling, MD   81 mg at 06/13/16 0926  . bisacodyl (DULCOLAX) suppository 10 mg  10 mg Rectal Daily PRN Kennedy Bucker, MD      . calcium carbonate (TUMS - dosed in mg elemental calcium) chewable tablet 200 mg of elemental calcium  1 tablet Oral Daily Altamese Dilling, MD   200 mg of elemental calcium at 06/13/16 0927  . ceFAZolin (ANCEF) IVPB 2g/100 mL premix  2 g Intravenous Q6H Kennedy Bucker, MD   2 g at 06/13/16 0146  . diphenhydrAMINE (BENADRYL) 12.5  MG/5ML elixir 12.5-25 mg  12.5-25 mg Oral Q4H PRN Kennedy BuckerMichael Menz, MD      . docusate sodium (COLACE) capsule 100 mg  100 mg Oral BID Kennedy BuckerMichael Menz, MD   100 mg at 06/13/16 [id number]  . donepezil (ARICEPT) tablet 10 mg  10 mg Oral QHS Altamese DillingVaibhavkumar Vachhani, MD   10 mg at 06/12/16 2230  . enoxaparin (LOVENOX) injection 40 mg  40 mg Subcutaneous Q24H Kennedy BuckerMichael Menz, MD      . magnesium citrate solution 1 Bottle  1 Bottle Oral Once PRN Kennedy BuckerMichael Menz, MD      . magnesium hydroxide (MILK OF MAGNESIA) suspension 30 mL  30 mL Oral Daily PRN Kennedy BuckerMichael Menz, MD      . menthol-cetylpyridinium (CEPACOL) lozenge 3 mg  1 lozenge Oral PRN Kennedy BuckerMichael Menz, MD       Or  . phenol (CHLORASEPTIC) mouth spray 1 spray  1 spray Mouth/Throat PRN Kennedy BuckerMichael Menz, MD      .  morphine 2 MG/ML injection 2 mg  2 mg Intravenous Q1H PRN Kennedy BuckerMichael Menz, MD      . morphine 4 MG/ML injection 4 mg  4 mg Intravenous Once Jennye MoccasinBrian S Quigley, MD      . multivitamin-lutein (OCUVITE-LUTEIN) capsule 1 capsule  1 capsule Oral Daily Altamese DillingVaibhavkumar Vachhani, MD   1 capsule at 06/13/16 0927  . ondansetron (ZOFRAN) tablet 4 mg  4 mg Oral Q6H PRN Kennedy BuckerMichael Menz, MD       Or  . ondansetron [facility](ZOFRAN) injection 4 mg  4 mg Intravenous Q6H PRN Kennedy BuckerMichael Menz, MD      . oxyCODONE (Oxy IR/ROXICODONE) immediate release tablet 5-10 mg  5-10 mg Oral Q3H PRN Kennedy BuckerMichael Menz, MD   5 mg at 06/13/16 0925  . pantoprazole (PROTONIX) EC tablet 40 mg  40 mg Oral Daily Altamese DillingVaibhavkumar Vachhani, MD   40 mg at 06/13/16 0926  . sertraline (ZOLOFT) tablet 25 mg  25 mg Oral Daily Altamese DillingVaibhavkumar Vachhani, MD   25 mg at 06/13/16 0927  . vitamin B-12 (CYANOCOBALAMIN) tablet 1,000 mcg  1,000 mcg Oral Daily Altamese DillingVaibhavkumar Vachhani, MD   1,000 mcg at 06/13/16 [id number]     Discharge Medications: Please see discharge summary for a list of discharge medications.  Relevant Imaging Results:  Relevant Lab Results:   Additional Information  (SSN: [id number])  Jazz Rogala, Darleen CrockerBailey M, LCSW

## 2016-06-13 NOTE — Progress Notes (Signed)
Physical Therapy Treatment Patient Details Name: [name] MRN: [id number] DOB: [birth year] Today's Date: 06/13/2016    History of Present Illness Pt is a 82 y.o. female s/p fall with L hip pain and striking back of head on refrigerator.  Pt s/p L hip hemiarthroplasty anterior approach 06/12/16 secondary to finding L displaced femoral neck fx.  PMH includes htn, THA, dementia.    PT Comments    Pt able to take a few shuffling steps back to bed this afternoon using RW and mod assist but limited ambulation d/t fatigue and L hip/thigh pain.  Pt with minimal (1-2/10) L hip/thigh pain at rest end of session.  Will continue to progress pt with LE strengthening and progressive ambulation distance per pt tolerance.   Follow Up Recommendations  SNF     Equipment Recommendations  Rolling walker with 5" wheels    Recommendations for Other Services       Precautions / Restrictions Precautions Precautions: Anterior Hip Restrictions Weight Bearing Restrictions: Yes LLE Weight Bearing: Weight bearing as tolerated    Mobility  Bed Mobility Overal bed mobility: Needs Assistance Bed Mobility: Sit to Supine     Sit to supine: Mod assist;Max assist   General bed mobility comments: assist for trunk and B LE's; vc's and assist for bridging (use of trapeze) to scoot hips towards middle of bed  Transfers Overall transfer level: Needs assistance Equipment used: Rolling walker (2 wheeled) Transfers: Sit to/from Stand Sit to Stand: Mod assist       General transfer comment: x1 trial recliner mod assist with RW; x2 trial commode (pt performing hygiene); vc's for hand and feet placement required  Ambulation/Gait Ambulation/Gait assistance: Mod assist Ambulation Distance (Feet): 3 Feet Assistive device: Rolling walker (2 wheeled) Gait Pattern/deviations: Decreased stance time - left;Antalgic;Decreased step length - right;Decreased step length - left;Decreased weight shift to left Gait  velocity: decreased   General Gait Details: shuffling steps commode to bed; vc's for walker use and gait technique required; limited distance d/t L hip pain   Stairs            Wheelchair Mobility    Modified Rankin (Stroke Patients Only)       Balance Overall balance assessment: Needs assistance Sitting-balance support: Bilateral upper extremity supported;Feet supported Sitting balance-Leahy Scale: Fair     Standing balance support: Bilateral upper extremity supported (on RW) Standing balance-Leahy Scale: Poor Standing balance comment: posterior lean initially in standing                    Cognition Arousal/Alertness: Awake/alert Behavior During Therapy: WFL for tasks assessed/performed Overall Cognitive Status: Within Functional Limits for tasks assessed (Oriented to person, place, time, and situation.  H/o dementia.)                      Exercises  Pt declined ex's d/t fatigue after getting back to bed (pt wanting to rest instead).    General Comments General comments (skin integrity, edema, etc.): Pt's daughter present intermittently during session.  Pt agreeable to PT session and requesting to toilet and then get back to bed.      Pertinent Vitals/Pain Pain Assessment: 0-10 Pain Score: 6  Pain Location: Left Hip Pain Descriptors / Indicators: Sore;Tender;Grimacing;Operative site guarding Pain Intervention(s): Limited activity within patient's tolerance;Monitored during session;Repositioned;Ice applied;Premedicated before session    Home Living Family/patient expects to be discharged to:: Private residence Living Arrangements: Alone Available Help at Discharge: Family Type of  Home: Independent living facility Home Access: Level entry;Elevator   Home Layout: One level (2nd floor apt) Home Equipment: Walker - 4 wheels;Walker - 2 wheels;Bedside commode;Toilet riser (w/c in storage; life alert)      Prior Function Level of Independence:  Independent with assistive device(s)    ADL's / Homemaking Assistance Needed: Uses 4ww.  Independent with ADL's.  (-) driving     PT Goals (current goals can now be found in the care plan section) Acute Rehab PT Goals Patient Stated Goal: to be able to walk again PT Goal Formulation: With patient Time For Goal Achievement: 06/27/16 Potential to Achieve Goals: Good Progress towards PT goals: Progressing toward goals    Frequency    BID      PT Plan Current plan remains appropriate    Co-evaluation             End of Session Equipment Utilized During Treatment: Gait belt Activity Tolerance: Patient limited by fatigue;Patient limited by pain Patient left: in bed;with call bell/phone within reach;with bed alarm set;with SCD's reapplied (B heels elevated via pillow)     Time: 1342-1410 PT Time Calculation (min) (ACUTE ONLY): 28 min  Charges:  $Therapeutic Activity: 23-37 mins                    G CodesHendricks Limes:      Alois Mincer 06/13/2016, 2:26 PM Hendricks LimesEmily Olanrewaju Osborn, PT [phone]

## 2016-06-13 NOTE — Progress Notes (Signed)
Initial Nutrition Assessment  DOCUMENTATION CODES:   Not applicable  INTERVENTION:   Magic cup TID with meals, each supplement provides 290 kcal and 9 grams of protein  NUTRITION DIAGNOSIS:   Inadequate oral intake related to poor appetite as evidenced by per patient/family report, meal completion < 50%, severe depletion of muscle mass.  GOAL:   Patient will meet greater than or equal to 90% of their needs  MONITOR:   PO intake  REASON FOR ASSESSMENT:   Consult Hip fracture protocol  ASSESSMENT:   82 y.o. female who was admitted to [facility] for a Left Anterior Approach Hemiarthroplasty repair of a left hip fracture.   Met with pt in room today. Pt eating 50% meals. Pt reports that she does not eat a lot at home normally but that she was eating normal pta. Pt reports stable wts. There is no wt history for this pt per chart. Explained to pt the importance of adequate protein intake. Pt does not like Ensure. Willing to try magic cups.   Medications reviewed and include: aspirin, Ca carbonate, lovenox, MTV, protonix, vit B-12, colace, oxycodone    Labs reviewed: Na 130(L), Cl 99(L), Ca 8.1(L) Wbc-12.3(L)   Nutrition-Focused physical exam completed. Findings are no fat depletion, severe muscle depletion, and moderate edema.   Diet Order:  Diet regular Room service appropriate? Yes; Fluid consistency: Thin  Skin:  Wound (see comment) (incision hip)  Last BM:  none since admit  Height:   Ht Readings from Last 1 Encounters:  06/12/16 5' 2"  (1.575 m)    Weight:   Wt Readings from Last 1 Encounters:  06/12/16 149 lb (67.6 kg)    Ideal Body Weight:  50 kg  BMI:  Body mass index is 27.25 kg/m.  Estimated Nutritional Needs:   Kcal:  1300-1500kcal/day   Protein:  75-88g/day   Fluid:  >1.3L/day   EDUCATION NEEDS:   No education needs identified at this time  Koleen Distance, RD, LDN Pager #[phone]

## 2016-06-13 NOTE — Progress Notes (Signed)
Patient ID: [name], female   DOB: 1923-10-19, 82 y.o.   MRN: [id number]  [facility] PROGRESS NOTE  Maria Barterolly G Aubry [id number]:[id number] DOB: 1923-10-19 DOA: 06/12/2016 PCP: No PCP Per Patient  HPI/Subjective: Patient states that she had a fall yesterday. Currently offers no complaints. Appetite not that good. Took a few steps to get in the chair.  Objective: Vitals:   06/13/16 0817 06/13/16 1115  BP: (!) 123/50 (!) 113/45  Pulse: 77 78  Resp: 16 18  Temp: 98.3 F (36.8 C) 97.3 F (36.3 C)    Filed Weights   06/12/16 0836  Weight: 67.6 kg (149 lb)    ROS: Review of Systems  Constitutional: Negative for chills and fever.  Eyes: Negative for blurred vision.  Respiratory: Negative for cough and shortness of breath.   Cardiovascular: Negative for chest pain.  Gastrointestinal: Negative for abdominal pain, constipation, diarrhea, nausea and vomiting.  Genitourinary: Negative for dysuria.  Musculoskeletal: Negative for joint pain.  Neurological: Negative for dizziness and headaches.   Exam: Physical Exam  HENT:  Nose: No mucosal edema.  Mouth/Throat: No oropharyngeal exudate or posterior oropharyngeal edema.  Eyes: Conjunctivae, EOM and lids are normal. Pupils are equal, round, and reactive to light.  Neck: No JVD present. Carotid bruit is not present. No edema present. No thyroid mass and no thyromegaly present.  Cardiovascular: S1 normal and S2 normal.  Exam reveals no gallop.   No murmur heard. Pulses:      Dorsalis pedis pulses are 2+ on the right side, and 2+ on the left side.  Respiratory: No respiratory distress. She has no wheezes. She has rhonchi in the right middle field, the right lower field and the left lower field. She has no rales.  GI: Soft. Bowel sounds are normal. There is no tenderness.  Musculoskeletal:       Right ankle: She exhibits swelling.       Left ankle: She exhibits swelling.  Unable to lift up left leg in the chair.  Lymphadenopathy:     She has no cervical adenopathy.  Neurological: She is alert. No cranial nerve deficit.  Skin: Skin is warm. No rash noted. Nails show no clubbing.  Psychiatric: She has a normal mood and affect.      Data Reviewed: Basic Metabolic Panel:  Recent Labs Lab 06/12/16 0838 06/13/16 0353  NA 132* 130*  K 4.1 3.7  CL 99* 99*  CO2 29 27  GLUCOSE 101* 113*  BUN 15 12  CREATININE 0.62 0.64  CALCIUM 9.1 8.1*   Liver Function Tests:  Recent Labs Lab 06/12/16 0838  AST 18  ALT 16  ALKPHOS 74  BILITOT 0.6  PROT 6.5  ALBUMIN 3.7   CBC:  Recent Labs Lab 06/12/16 0838 06/13/16 0353  WBC 6.4 12.3*  NEUTROABS 3.6  --   HGB 12.4 10.4*  HCT 36.5 31.6*  MCV 79.9* 80.1  PLT 258 222     Studies: Dg Chest Port 1 View  Result Date: 06/12/2016 CLINICAL DATA:  Tripped and fall this morning. Left hip pain. Initial encounter. EXAM: PORTABLE CHEST 1 VIEW COMPARISON:  07/31/2013 FINDINGS: Cardiac silhouette remains mildly enlarged, unchanged. Ectatic appearance of the thoracic aorta is similar to the prior study with atherosclerotic calcification again seen. There is mild biapical scarring with calcification on the right. There is no evidence of acute airspace consolidation, edema, pleural effusion, or pneumothorax. No acute osseous abnormality is identified. IMPRESSION: No active disease. Electronically Signed   By: Freida BusmanAllen  Mosetta PuttGrady M.D.   On: 06/12/2016 09:20   Dg Hip Operative Unilat W Or W/o Pelvis Left  Result Date: 06/12/2016 CLINICAL DATA:  Status post left subcapital fracture today. Intraoperative imaging for hip replacement. Initial encounter. EXAM: OPERATIVE LEFT HIP (WITH PELVIS IF PERFORMED) 3 VIEWS TECHNIQUE: Fluoroscopic spot image(s) were submitted for interpretation post-operatively. COMPARISON:  Plain films left hip earlier today. FINDINGS: Images demonstrate a left hip arthroplasty in place. The device is located. No acute abnormality is identified. IMPRESSION:  Intraoperative imaging for left hip replacement.  No acute finding. Electronically Signed   By: Drusilla Kannerhomas  Dalessio M.D.   On: 06/12/2016 14:43   Dg Hip Unilat W Or W/o Pelvis 2-3 Views Left  Result Date: 06/12/2016 CLINICAL DATA:  Status post left hip replacement for a subcapital fracture suffered due to a fall earlier today. Initial encounter. EXAM: DG HIP (WITH OR WITHOUT PELVIS) 2-3V LEFT COMPARISON:  Plain films left hip earlier today appear FINDINGS: Left hip arthroplasty is in place. The device is located and there is no fracture. Surgical staples noted. IMPRESSION: Left hip replacement without complication.  No acute abnormality. Electronically Signed   By: Drusilla Kannerhomas  Dalessio M.D.   On: 06/12/2016 15:07   Dg Hip Unilat With Pelvis 2-3 Views Left  Result Date: 06/12/2016 CLINICAL DATA:  Initial encounter for Pt arrives via EMS from home, pt fell and tripped in her kitchen this AM and is complaining of left hip pain, EMS gave 50 mcg of fentayl, denies any LOC, EDP at bedside, uses walker at home EXAM: DG HIP (WITH OR WITHOUT PELVIS) 2-3V LEFT COMPARISON:  None. FINDINGS: Right hip arthroplasty. Osteopenia. Sacroiliac joints are symmetric. Displaced fracture of the mid left cervical neck with comminution and lateral displacement of distal fracture fragment. Vascular calcifications. IMPRESSION: Left femoral neck fracture. Electronically Signed   By: Jeronimo GreavesKyle  Talbot M.D.   On: 06/12/2016 09:23    Scheduled Meds: . acetaminophen  1,000 mg Intravenous Q6H  . amLODipine  10 mg Oral Daily  . aspirin EC  81 mg Oral Daily  . calcium carbonate  1 tablet Oral Daily  . docusate sodium  100 mg Oral BID  . donepezil  10 mg Oral QHS  . enoxaparin (LOVENOX) injection  40 mg Subcutaneous Q24H  .  morphine injection  4 mg Intravenous Once  . multivitamin-lutein  1 capsule Oral Daily  . pantoprazole  40 mg Oral Daily  . sertraline  25 mg Oral Daily  . vitamin B-12  1,000 mcg Oral Daily     Assessment/Plan:  1. Essential hypertension on Norvasc 2. Mild dementia on Aricept 3. GERD on Protonix 4. Left hip fracture requiring operative repair. Pain control with when necessary medication. Physical therapy recommends rehabilitation. 5. Mild fluid overload from IV fluids. Stop IV fluids. Try to hold off on Lasix dose.  Code Status:     Code Status Orders        Start     Ordered   06/12/16 1255  Do not attempt resuscitation (DNR)  Continuous    Question Answer Comment  In the event of cardiac or respiratory ARREST Do not call a "code blue"   In the event of cardiac or respiratory ARREST Do not perform Intubation, CPR, defibrillation or ACLS   In the event of cardiac or respiratory ARREST Use medication by any route, position, wound care, and other measures to relive pain and suffering. May use oxygen, suction and manual treatment of airway obstruction as needed for comfort.  06/12/16 1254    Code Status History    Date Active Date Inactive Code Status Order ID Comments User Context   06/12/2016 11:12 AM 06/12/2016 12:54 PM Full Code [id number]  Kennedy Bucker, MD ED    Advance Directive Documentation   Flowsheet Row Most Recent Value  Type of Advance Directive  Healthcare Power of Attorney  Pre-existing out of facility DNR order (yellow form or pink MOST form)  Yellow form placed in chart (order not valid for inpatient use)  "MOST" Form in Place?  No data     Family Communication: Daughter at the bedside Disposition Plan: Potentially out to rehabilitation tomorrow or Wednesday.  Consultants:  Orthopedic surgery  Procedures: -  left hip hemiarthroplasty  Time spent: 25 minutes  Alford Highland  [employer]

## 2016-06-13 NOTE — Evaluation (Signed)
Physical Therapy Evaluation Patient Details Name: [name] MRN: [id number] DOB: 07/07/1923 Today's Date: 06/13/2016   History of Present Illness  Pt is a 82 y.o. female s/p fall with L hip pain and striking back of head on refrigerator.  Pt s/p L hip hemiarthroplasty anterior approach 06/12/16 secondary to finding L displaced femoral neck fx.  PMH includes htn, THA, dementia.  Clinical Impression  Prior to hospital admission, pt was ambulating modified independently with rollator.  Pt lives alone in [facility] Living [facility].  Currently pt is mod to max assist supine to sit, mod to max assist with transfers, and mod to max assist to take a few steps with RW (2nd assist present towards end of session for safety with mobility in order to get pt from commode to recliner).  Pt would benefit from skilled PT to address noted impairments and functional limitations.  Recommend pt discharge to STR when medically appropriate.    Follow Up Recommendations SNF    Equipment Recommendations  Rolling walker with 5" wheels    Recommendations for Other Services       Precautions / Restrictions Precautions Precautions: Anterior Hip Restrictions Weight Bearing Restrictions: Yes LLE Weight Bearing: Weight bearing as tolerated      Mobility  Bed Mobility Overal bed mobility: Needs Assistance Bed Mobility: Supine to Sit     Supine to sit: Mod assist;Max assist;HOB elevated     General bed mobility comments: assist for trunk and B LE's; vc's and assist for bridging to scoot hips towards side of bed initially and then to reach for bedrail to assist with sitting up  Transfers Overall transfer level: Needs assistance Equipment used: Rolling walker (2 wheeled) Transfers: Sit to/from [employer] CorporationStand;Stand Pivot Transfers Sit to Stand: Mod assist Stand pivot transfers: Mod assist;Max assist       General transfer comment: x3 trials standing with RW edge of bed (pt with posterior lean and difficulty  shifting weight forward even with tactile and vc's); stand pivot x2 attempts bed to commode (pt with difficulty following cues for transfer requiring increased time and modified cueing); sit to stand from commode with RW min to mod assist  Ambulation/Gait Ambulation/Gait assistance: Mod assist;Max assist Ambulation Distance (Feet): 2 Feet (backwards) Assistive device: Rolling walker (2 wheeled) Gait Pattern/deviations: Decreased stance time - left;Antalgic;Decreased step length - right;Decreased step length - left;Decreased weight shift to left Gait velocity: decreased   General Gait Details: pt able to take a few shuffling steps backwards (pt stood from commode, commode removed, and pt took steps back towards recliner) with cueing for walker use and gait technique; limited distance d/t L hip pain  Stairs            Wheelchair Mobility    Modified Rankin (Stroke Patients Only)       Balance Overall balance assessment: Needs assistance Sitting-balance support: Bilateral upper extremity supported;Feet supported Sitting balance-Leahy Scale: Fair     Standing balance support: Bilateral upper extremity supported (on RW) Standing balance-Leahy Scale: Poor Standing balance comment: posterior lean in standing                             Pertinent Vitals/Pain Pain Assessment: 0-10 Pain Score: 6  Pain Location: Left Hip Pain Descriptors / Indicators: Sore;Tender;Grimacing;Operative site guarding Pain Intervention(s): Limited activity within patient's tolerance;Monitored during session;Premedicated before session;Repositioned;Ice applied  Vitals (HR and O2) stable and WFL throughout treatment session.     Home Living  Family/patient expects to be discharged to:: Private residence Living Arrangements: Alone Available Help at Discharge: Family Type of Home: Independent living facility Home Access: Level entry;Elevator     Home Layout: One level (2nd floor apt) Home  Equipment: Walker - 4 wheels;Walker - 2 wheels;Bedside commode;Toilet riser (w/c in storage; life alert)      Prior Function Level of Independence: Independent with assistive device(s)      ADL's / Homemaking Assistance Needed: Uses 4ww.  Independent with ADL's.  (-) driving        Hand Dominance   Dominant Hand: Right    Extremity/Trunk Assessment   Upper Extremity Assessment Upper Extremity Assessment: Defer to OT evaluation    Lower Extremity Assessment Lower Extremity Assessment: RLE deficits/detail;LLE deficits/detail RLE Deficits / Details: generalized weakness LLE Deficits / Details: hip flexion at least 2+/5 (limited d/t pain); knee flexion/extension at least 3/5; DF at least 3+/5 LLE: Unable to fully assess due to pain       Communication   Communication: HOH  Cognition Arousal/Alertness: Awake/alert Behavior During Therapy: WFL for tasks assessed/performed Overall Cognitive Status: Within Functional Limits for tasks assessed (Oriented to person, place, time, and situation.  H/o dementia)                      General Comments General comments (skin integrity, edema, etc.): Pt's daughter present intermittently during session.  Nursing cleared pt for participation in physical therapy.  Pt agreeable to PT session.    Exercises  Transfer training.  Deferred ex's d/t pt needing to toilet upon PT entering room.   Assessment/Plan    PT Assessment Patient needs continued PT services  PT Problem List Decreased strength;Decreased activity tolerance;Decreased balance;Decreased mobility;Decreased knowledge of use of DME;Decreased knowledge of precautions;Pain          PT Treatment Interventions DME instruction;Gait training;Functional mobility training;Therapeutic activities;Therapeutic exercise;Balance training;Patient/family education    PT Goals (Current goals can be found in the Care Plan section)  Acute Rehab PT Goals Patient Stated Goal: to be able to  walk again PT Goal Formulation: With patient Time For Goal Achievement: 06/27/16 Potential to Achieve Goals: Good    Frequency BID   Barriers to discharge Decreased caregiver support      Co-evaluation               End of Session Equipment Utilized During Treatment: Gait belt Activity Tolerance: Patient limited by fatigue;Patient limited by pain Patient left: in chair;with call bell/phone within reach;with chair alarm set;with family/visitor present;with SCD's reapplied (B heels elevated via pillow) Nurse Communication: Mobility status;Precautions         Time: 1005-1040 PT Time Calculation (min) (ACUTE ONLY): 35 min   Charges:   PT Evaluation $PT Eval Low Complexity: 1 Procedure PT Treatments $Therapeutic Activity: 8-22 mins   PT G CodesHendricks Jordan:        [name] 06/13/2016, 12:19 PM Maria LimesEmily Hobart Marte, PT [phone]

## 2016-06-13 NOTE — Clinical Social Work Placement (Signed)
   CLINICAL SOCIAL WORK PLACEMENT  NOTE  Date:  06/13/2016  Patient Details  Name: [name] MRN: [id number] Date of Birth: [birth year]  Clinical Social Work is seeking post-discharge placement for this patient at the Skilled  Nursing Facility level of care (*CSW will initial, date and re-position this form in  chart as items are completed):  Yes   Patient/family provided with [place] Clinical Social Work Department's list of facilities offering this level of care within the geographic area requested by the patient (or if unable, by the patient's family).  Yes   Patient/family informed of their freedom to choose among providers that offer the needed level of care, that participate in Medicare, Medicaid or managed care program needed by the patient, have an available bed and are willing to accept the patient.  Yes   Patient/family informed of [facility] interest [facility] and [facility], as well as of the fact that they are under no obligation to receive care at these facilities.  PASRR submitted to EDS on       PASRR number received on       Existing PASRR number confirmed on 06/13/16     FL2 transmitted to all facilities in geographic area requested by pt/family on 06/13/16     FL2 transmitted to all facilities within larger geographic area on       Patient informed that his/her managed care company has contracts with or will negotiate with certain facilities, including the following:        Yes   Patient/family informed of bed offers received.  Patient chooses bed at  [facility]     Physician recommends and patient chooses bed at      Patient to be transferred to   on  .  Patient to be transferred to facility by       Patient family notified on   of transfer.  Name of family member notified:        PHYSICIAN       Additional Comment:    _______________________________________________ Kennis Wissmann, Darleen CrockerBailey M, LCSW 06/13/2016,  10:55 AM

## 2016-06-14 ENCOUNTER — Encounter
Admission: RE | Admit: 2016-06-14 | Discharge: 2016-06-14 | Disposition: A | Discharge status: Home / Self Care | Payer: PPO | Source: Ambulatory Visit | Attending: Internal Medicine | Admitting: Internal Medicine

## 2016-06-14 ENCOUNTER — Inpatient Hospital Stay: Payer: PPO

## 2016-06-14 LAB — CBC
HEMATOCRIT: 31.4 % — AB (ref 35.0–47.0)
Hemoglobin: 10.4 g/dL — ABNORMAL LOW (ref 12.0–16.0)
MCH: 26.4 pg (ref 26.0–34.0)
MCHC: 33.2 g/dL (ref 32.0–36.0)
MCV: 79.6 fL — AB (ref 80.0–100.0)
PLATELETS: 211 10*3/uL (ref 150–440)
RBC: 3.94 MIL/uL (ref 3.80–5.20)
RDW: 13.5 % (ref 11.5–14.5)
WBC: 12.9 10*3/uL — ABNORMAL HIGH (ref 3.6–11.0)

## 2016-06-14 LAB — BASIC METABOLIC PANEL
ANION GAP: 6 (ref 5–15)
BUN: 16 mg/dL (ref 6–20)
CALCIUM: 8.2 mg/dL — AB (ref 8.9–10.3)
CO2: 26 mmol/L (ref 22–32)
CREATININE: 0.7 mg/dL (ref 0.44–1.00)
Chloride: 99 mmol/L — ABNORMAL LOW (ref 101–111)
GFR calc Af Amer: >60 mL/min (ref >60)
Glucose, Bld: 113 mg/dL — ABNORMAL HIGH (ref 65–99)
Potassium: 3.4 mmol/L — ABNORMAL LOW (ref 3.5–5.1)
Sodium: 131 mmol/L — ABNORMAL LOW (ref 135–145)

## 2016-06-14 LAB — SURGICAL PATHOLOGY

## 2016-06-14 MED ORDER — POTASSIUM CHLORIDE CRYS ER 20 MEQ PO TBCR
20.0000 meq | EXTENDED_RELEASE_TABLET | Freq: Once | ORAL | Status: AC
  Administered 2016-06-14: 20 meq via ORAL
  Filled 2016-06-14: qty 1

## 2016-06-14 MED ORDER — IPRATROPIUM-ALBUTEROL 0.5-2.5 (3) MG/3ML IN SOLN
3.0000 mL | Freq: Four times a day (QID) | RESPIRATORY_TRACT | Status: DC
  Administered 2016-06-14 – 2016-06-16 (×9): 3 mL via RESPIRATORY_TRACT
  Filled 2016-06-14 (×9): qty 3

## 2016-06-14 MED ORDER — LEVOFLOXACIN IN D5W 750 MG/150ML IV SOLN
750.0000 mg | INTRAVENOUS | Status: DC
  Administered 2016-06-14: 750 mg via INTRAVENOUS
  Filled 2016-06-14: qty 150

## 2016-06-14 MED ORDER — AMLODIPINE BESYLATE 5 MG PO TABS
5.0000 mg | ORAL_TABLET | Freq: Every day | ORAL | Status: DC
  Administered 2016-06-14: 5 mg via ORAL
  Filled 2016-06-14: qty 1

## 2016-06-14 MED ORDER — FUROSEMIDE 10 MG/ML IJ SOLN
20.0000 mg | Freq: Once | INTRAMUSCULAR | Status: AC
  Administered 2016-06-14: 20 mg via INTRAVENOUS
  Filled 2016-06-14: qty 2

## 2016-06-14 NOTE — Progress Notes (Addendum)
Health Team authorization has been received. Auth # [id number]. [facility] aware of above. [facility].   Per MD patient will likely be ready for D/C tomorrow. [facility] aware of above. Patient's daughter Boneta LucksJenny is aware of above.   Baker Hughes IncorporatedBailey Satoya Feeley, LCSW [phone]

## 2016-06-14 NOTE — Progress Notes (Signed)
OT Cancellation Note  Patient Details Name: [name] MRN: [id number] DOB: 02/28/1924   Cancelled Treatment:    Reason Eval/Treat Not Completed: Patient at procedure or test/ unavailable.  Unable to see patient due to having chest xray.  Will continue with OT tomorrow after results of chest xray are reviewed.     Susanne BordersSusan Wofford, OTR/L ascom [phone] 06/14/16, 3:08 PM

## 2016-06-14 NOTE — Progress Notes (Signed)
  Subjective: 2 Days Post-Op Procedure(s) (LRB): left hip hemiarthroplasty anterior approach (Left) Patient reports pain as mild.   Patient is well, and has had no acute complaints or problems PT to work with patient, care management to assist with discharge.  Currently recommending SNF. Negative for chest pain and shortness of breath Fever: no Gastrointestinal:Negative for nausea and vomiting  Objective: Vital signs in last 24 hours: Temp:  [97.3 F (36.3 C)-99.5 F (37.5 C)] 99.5 F (37.5 C) (12/27 0603) Pulse Rate:  [77-105] 81 (12/27 0553) Resp:  [16-21] 18 (12/27 0553) BP: (102-123)/(45-52) 119/52 (12/27 0553) SpO2:  [90 %-94 %] 93 % (12/27 0553)  Intake/Output from previous day:  Intake/Output Summary (Last 24 hours) at 06/14/16 0717 Last data filed at 06/13/16 1908  Gross per 24 hour  Intake              580 ml  Output              350 ml  Net              230 ml    Intake/Output this shift: No intake/output data recorded.  Labs:  Recent Labs  06/12/16 0838 06/13/16 0353 06/14/16 0404  HGB 12.4 10.4* 10.4*    Recent Labs  06/13/16 0353 06/14/16 0404  WBC 12.3* 12.9*  RBC 3.94 3.94  HCT 31.6* 31.4*  PLT 222 211    Recent Labs  06/13/16 0353 06/14/16 0404  NA 130* 131*  K 3.7 3.4*  CL 99* 99*  CO2 27 26  BUN 12 16  CREATININE 0.64 0.70  GLUCOSE 113* 113*  CALCIUM 8.1* 8.2*   No results for input(s): LABPT, INR in the last 72 hours.   EXAM General - Patient is Alert, Appropriate and Oriented Extremity - ABD soft Sensation intact distally Intact pulses distally Dorsiflexion/Plantar flexion intact Incision: scant drainage No cellulitis present Dressing/Incision - blood tinged drainage, minimal at the distal aspect of the incision. Motor Function - intact, moving foot and toes well on exam.  Abd soft, non-tender with normal BS. Positive Homan's to the right leg.  Past Medical History:  Diagnosis Date  . Dementia   . Hypertension    . Macular degeneration    Assessment/Plan: 2 Days Post-Op Procedure(s) (LRB): left hip hemiarthroplasty anterior approach (Left) Principal Problem:   Hip fracture (HCC)  Estimated body mass index is 27.25 kg/m as calculated from the following:   Height as of this encounter: 5\' 2"  (1.575 m).   Weight as of this encounter: 67.6 kg (149 lb). Advance diet Up with therapy   Labs reviewed, WBC 12.9, Temp 99.5.  Denies any chest pain or SOB, will place order for UA. Positive Homan's sign to the right leg, will obtain US to rule out DVT. Na 131 this AM, BP 119/52, continue encourage oral intake, cutting back on fluids. Pt has had a BM. Up with therapy today.  DVT Prophylaxis - Lovenox, Foot Pumps and TED hose Weight-Bearing as tolerated to left leg  J. Horris LatinoLance McGhee, PA-C [facility] 06/14/2016, 7:17 AM

## 2016-06-14 NOTE — Progress Notes (Signed)
Pharmacy Antibiotic Note  [name] is a 82 y.o. female admitted on 06/12/2016 with pneumonia.  Pharmacy has been consulted for Levaquin dosing.  Plan: Will begin Levaquin 750 mg IV q48h for Crcl 42 ml/min.    Height: 5\' 2"  (157.5 cm) Weight: 149 lb (67.6 kg) IBW/kg (Calculated) : 50.1  Temp (24hrs), Avg:99 F (37.2 C), Min:98.2 F (36.8 C), Max:99.5 F (37.5 C)   Recent Labs Lab 06/12/16 0838 06/13/16 0353 06/14/16 0404  WBC 6.4 12.3* 12.9*  CREATININE 0.62 0.64 0.70    Estimated Creatinine Clearance: 42.1 mL/min (by C-G formula based on SCr of 0.7 mg/dL).    No Known Allergies  Antimicrobials this admission: Cefazolin pre/post surgery 12/25 Levaquin 12/27 >>       >>    Dose adjustments this admission:    Microbiology results:   BCx:     UCx:      Sputum:      MRSA PCR:   12/27 CXR: PNA  Thank you for allowing pharmacy to be a part of this patient's care.  Neko Mcgeehan A 06/14/2016 4:06 PM

## 2016-06-14 NOTE — Progress Notes (Signed)
Patient ID: [name], female   DOB: 05-30-1924, 82 y.o.   MRN: [id number]   [facility] PROGRESS NOTE  [name] FAO:[id number] DOB: 05-30-1924 DOA: 06/12/2016 PCP: No PCP Per Patient  HPI/Subjective: Patient seen earlier and again this afternoon. This afternoon she was sitting in the chair. She feels okay. Some pain when she moves her hip. Otherwise feels well. No complaints of shortness of breath.  Objective: Vitals:   06/14/16 0735 06/14/16 1046  BP: 114/61   Pulse: 88 75  Resp: 18   Temp: 99.2 F (37.3 C)     Filed Weights   06/12/16 0836  Weight: 67.6 kg (149 lb)    ROS: Review of Systems  Constitutional: Negative for chills and fever.  Eyes: Negative for blurred vision.  Respiratory: Negative for cough and shortness of breath.   Cardiovascular: Negative for chest pain.  Gastrointestinal: Negative for abdominal pain, constipation, diarrhea, nausea and vomiting.  Genitourinary: Negative for dysuria.  Musculoskeletal: Positive for joint pain.  Neurological: Negative for dizziness and headaches.   Exam: Physical Exam  HENT:  Nose: No mucosal edema.  Mouth/Throat: No oropharyngeal exudate or posterior oropharyngeal edema.  Eyes: Conjunctivae, EOM and lids are normal. Pupils are equal, round, and reactive to light.  Neck: No JVD present. Carotid bruit is not present. No edema present. No thyroid mass and no thyromegaly present.  Cardiovascular: S1 normal and S2 normal.  Exam reveals no gallop.   No murmur heard. Pulses:      Dorsalis pedis pulses are 2+ on the right side, and 2+ on the left side.  Respiratory: No respiratory distress. She has no wheezes. She has rhonchi in the right middle field, the right lower field, the left middle field and the left lower field. She has no rales.  GI: Soft. Bowel sounds are normal. There is no tenderness.  Musculoskeletal:       Right ankle: She exhibits swelling.       Left ankle: She exhibits swelling.  Unable to  lift up left leg in the chair.  Lymphadenopathy:    She has no cervical adenopathy.  Neurological: She is alert. No cranial nerve deficit.  Skin: Skin is warm. No rash noted. Nails show no clubbing.  Psychiatric: She has a normal mood and affect.      Data Reviewed: Basic Metabolic Panel:  Recent Labs Lab 06/12/16 0838 06/13/16 0353 06/14/16 0404  NA 132* 130* 131*  K 4.1 3.7 3.4*  CL 99* 99* 99*  CO2 29 27 26   GLUCOSE 101* 113* 113*  BUN 15 12 16   CREATININE 0.62 0.64 0.70  CALCIUM 9.1 8.1* 8.2*   Liver Function Tests:  Recent Labs Lab 06/12/16 0838  AST 18  ALT 16  ALKPHOS 74  BILITOT 0.6  PROT 6.5  ALBUMIN 3.7   CBC:  Recent Labs Lab 06/12/16 0838 06/13/16 0353 06/14/16 0404  WBC 6.4 12.3* 12.9*  NEUTROABS 3.6  --   --   HGB 12.4 10.4* 10.4*  HCT 36.5 31.6* 31.4*  MCV 79.9* 80.1 79.6*  PLT 258 222 211     Studies: [place] Venous Img Lower Unilateral Left  Result Date: 06/14/2016 CLINICAL DATA:  Left lower extremity edema. Positive Homans sign. History of left hip surgery. Evaluate for DVT. EXAM: LEFT LOWER EXTREMITY VENOUS DOPPLER ULTRASOUND TECHNIQUE: Gray-scale sonography with graded compression, as well as color Doppler and duplex ultrasound were performed to evaluate the lower extremity deep venous systems from the level of the  common femoral vein and including the common femoral, femoral, profunda femoral, popliteal and calf veins including the posterior tibial, peroneal and gastrocnemius veins when visible. The superficial great saphenous vein was also interrogated. Spectral Doppler was utilized to evaluate flow at rest and with distal augmentation maneuvers in the common femoral, femoral and popliteal veins. COMPARISON:  None. FINDINGS: Contralateral Common Femoral Vein: Respiratory phasicity is normal and symmetric with the symptomatic side. No evidence of thrombus. Normal compressibility. Common Femoral Vein: No evidence of thrombus. Normal  compressibility, respiratory phasicity and response to augmentation. Saphenofemoral Junction: No evidence of thrombus. Normal compressibility and flow on color Doppler imaging. Profunda Femoral Vein: No evidence of thrombus. Normal compressibility and flow on color Doppler imaging. Femoral Vein: No evidence of thrombus. Normal compressibility, respiratory phasicity and response to augmentation. Popliteal Vein: No evidence of thrombus. Normal compressibility, respiratory phasicity and response to augmentation. Calf Veins: No evidence of thrombus. Normal compressibility and flow on color Doppler imaging. Superficial Great Saphenous Vein: No evidence of thrombus. Normal compressibility and flow on color Doppler imaging. Venous Reflux:  None. Other Findings:  None. IMPRESSION: No evidence of DVT within the left lower extremity. Electronically Signed   By: Simonne ComeJohn  Watts M.D.   On: 06/14/2016 10:44   Dg Hip Operative Unilat W Or W/o Pelvis Left  Result Date: 06/12/2016 CLINICAL DATA:  Status post left subcapital fracture today. Intraoperative imaging for hip replacement. Initial encounter. EXAM: OPERATIVE LEFT HIP (WITH PELVIS IF PERFORMED) 3 VIEWS TECHNIQUE: Fluoroscopic spot image(s) were submitted for interpretation post-operatively. COMPARISON:  Plain films left hip earlier today. FINDINGS: Images demonstrate a left hip arthroplasty in place. The device is located. No acute abnormality is identified. IMPRESSION: Intraoperative imaging for left hip replacement.  No acute finding. Electronically Signed   By: Drusilla Kannerhomas  Dalessio M.D.   On: 06/12/2016 14:43   Dg Hip Unilat W Or W/o Pelvis 2-3 Views Left  Result Date: 06/12/2016 CLINICAL DATA:  Status post left hip replacement for a subcapital fracture suffered due to a fall earlier today. Initial encounter. EXAM: DG HIP (WITH OR WITHOUT PELVIS) 2-3V LEFT COMPARISON:  Plain films left hip earlier today appear FINDINGS: Left hip arthroplasty is in place. The device is  located and there is no fracture. Surgical staples noted. IMPRESSION: Left hip replacement without complication.  No acute abnormality. Electronically Signed   By: Drusilla Kannerhomas  Dalessio M.D.   On: 06/12/2016 15:07    Scheduled Meds: . amLODipine  5 mg Oral Daily  . aspirin EC  81 mg Oral Daily  . calcium carbonate  1 tablet Oral Daily  . docusate sodium  100 mg Oral BID  . donepezil  10 mg Oral QHS  . enoxaparin (LOVENOX) injection  40 mg Subcutaneous Q24H  . ipratropium-albuterol  3 mL Nebulization Q6H  .  morphine injection  4 mg Intravenous Once  . multivitamin-lutein  1 capsule Oral Daily  . pantoprazole  40 mg Oral Daily  . sertraline  25 mg Oral Daily  . vitamin B-12  1,000 mcg Oral Daily    Assessment/Plan:  1. Mild fluid overload. Give 1 dose of Lasix and nebulizer treatments and obtain a chest x-ray. Hold off on disposition today.  2. Essential hypertension on Norvasc and which I lowered the dose 3. Mild dementia on Aricept 4. GERD on Protonix 5. Left hip fracture requiring operative repair. Pain control with when necessary medication. Physical therapy recommends rehabilitation.   Code Status:     Code Status Orders  Start     Ordered   06/12/16 1255  Do not attempt resuscitation (DNR)  Continuous    Question Answer Comment  In the event of cardiac or respiratory ARREST Do not call a "code blue"   In the event of cardiac or respiratory ARREST Do not perform Intubation, CPR, defibrillation or ACLS   In the event of cardiac or respiratory ARREST Use medication by any route, position, wound care, and other measures to relive pain and suffering. May use oxygen, suction and manual treatment of airway obstruction as needed for comfort.      06/12/16 1254    Code Status History    Date Active Date Inactive Code Status Order ID Comments User Context   06/12/2016 11:12 AM 06/12/2016 12:54 PM Full Code [id number]  Kennedy Bucker, MD ED    Advance Directive Documentation    Flowsheet Row Most Recent Value  Type of Advance Directive  Healthcare Power of Attorney  Pre-existing out of facility DNR order (yellow form or pink MOST form)  Yellow form placed in chart (order not valid for inpatient use)  "MOST" Form in Place?  No data     Family Communication: Earlier friend was at the bedside and this afternoon and daughter-in-law at the bedside Disposition Plan: Potentially out to rehabilitation tomorrow.  Consultants:  Orthopedic surgery  Procedures: -  left hip hemiarthroplasty  Time spent: 24 minutes  Alford Highland  [employer]

## 2016-06-14 NOTE — Progress Notes (Signed)
Physical Therapy Treatment Patient Details Name: [name] MRN: [id number] DOB: 11-27-1923 Today's Date: 06/14/2016    History of Present Illness Pt is a 82 y.o. female s/p fall with L hip pain and striking back of head on refrigerator.  Pt s/p L hip hemiarthroplasty anterior approach 06/12/16 secondary to finding L displaced femoral neck fx.  PMH includes htn, THA, dementia.    PT Comments    Pt agreeable to PT; notes less pain this afternoon post pain medication. Pt requires a little more assist initially with stand this session, but with second stand and ambulation, pt demonstrating improved ability and improved purposeful steps inconsistently with strong cueing. Pt progressed distance slightly with ambulation as well. Pt received up in chair comfortably and encouraged to perform ankle pumps, quad sets and glut sets throughout the afternoon. Continue PT to progress strength, endurance, and balance in stand to improve all functional mobility.   Follow Up Recommendations  SNF     Equipment Recommendations  Rolling walker with 5" wheels    Recommendations for Other Services       Precautions / Restrictions Precautions Precautions: Anterior Hip;Fall Restrictions Weight Bearing Restrictions: Yes LLE Weight Bearing: Weight bearing as tolerated    Mobility  Bed Mobility Overal bed mobility: Needs Assistance Bed Mobility: Supine to Sit     Supine to sit: Mod assist Sit to supine: Max assist   General bed mobility comments: Encouraged use of B hands on bed and glut squeeze tech to reach edge of bed; mod A with chuck pad. Assist for LEs and trunk  Transfers Overall transfer level: Needs assistance Equipment used: Rolling walker (2 wheeled) Transfers: Sit to/from Stand Sit to Stand: Min assist;Mod assist         General transfer comment: Cues for safe hand placement; increased assist for 1st transfer from bed to find balance; [facility] improved to Min  A  Ambulation/Gait Ambulation/Gait assistance: Min assist Ambulation Distance (Feet): 3 Feet (8 feet second walk BSC to recliner) Assistive device: Rolling walker (2 wheeled) Gait Pattern/deviations: Decreased step length - right;Decreased step length - left;Shuffle;Trunk flexed Gait velocity: decreased Gait velocity interpretation: <1.8 ft/sec, indicative of risk for recurrent falls General Gait Details: Encouraged 3 step sequence; inconsistent btn shuffling feet. When pt follows 3 step sequence, pt able to demonstrated nice purposeful steps and notes feeling easier to walk   Stairs            Wheelchair Mobility    Modified Rankin (Stroke Patients Only)       Balance Overall balance assessment: Needs assistance Sitting-balance support: Feet supported;Bilateral upper extremity supported Sitting balance-Leahy Scale: Fair     Standing balance support: Bilateral upper extremity supported Standing balance-Leahy Scale: Fair                      Cognition Arousal/Alertness: Awake/alert Behavior During Therapy: WFL for tasks assessed/performed Overall Cognitive Status: Within Functional Limits for tasks assessed                      Exercises Total Joint Exercises Ankle Circles/Pumps: AROM;Both;10 reps;Supine Quad Sets: Strengthening;Both;10 reps;Supine Gluteal Sets: Strengthening;Both;10 reps;Supine Other Exercises Other Exercises: Stand QS/GS while attaining stand and before gait Other Exercises: Encouraged AP, GS, QS in chair.     General Comments        Pertinent Vitals/Pain Pain Assessment: 0-10 Pain Score: 2  Pain Location: L hip Pain Intervention(s): Premedicated before session;Repositioned  Home Living                      Prior Function            PT Goals (current goals can now be found in the care plan section) Progress towards PT goals: Progressing toward goals    Frequency    BID      PT Plan Current plan  remains appropriate    Co-evaluation             End of Session Equipment Utilized During Treatment: Gait belt Activity Tolerance: Patient tolerated treatment well Patient left: with call bell/phone within reach;with SCD's reapplied;in chair;with chair alarm set;with family/visitor present     Time: [id number] PT Time Calculation (min) (ACUTE ONLY): 25 min  Charges:  $Gait Training: 8-22 mins $Therapeutic Activity: 8-22 mins                    G CodesScot Dock:      Aldrich Lloyd E Makinze Jani, PTA 06/14/2016, 1:00 PM

## 2016-06-14 NOTE — Progress Notes (Signed)
Physical Therapy Treatment Patient Details Name: [name] MRN: [id number] DOB: Oct 19, 1923 Today's Date: 06/14/2016    History of Present Illness Pt is a 82 y.o. female s/p fall with L hip pain and striking back of head on refrigerator.  Pt s/p L hip hemiarthroplasty anterior approach 06/12/16 secondary to finding L displaced femoral neck fx.  PMH includes htn, THA, dementia.    PT Comments    Chart review revealed pt sent for doppler of Left lower extremity; results negative for DVT. Pt agreeable to PT and notes need to use the bathroom. Pt requires Mod A and use of trapeze for bed mobility to sit; Max assist back to supine and ability to reposition upward in bed with use of trapeze and cues for use of Bilateral lower extremities in coordination with upper extremities. Transfers improving to Min A, as well as ambulation to Min A physically with heavy cues for hand placement throughout transfers and ambulation and sequencing. Pt refuses up in chair at this time due to fatigue; pt placed back to bed and encouraged to perform ankle pumps, quad sets and glut sets for low level strengthening to assist with functional mobility. Continue PT this afternoon for progression of strength, endurance and out of bed activity to improve all functional mobility.   Follow Up Recommendations  SNF     Equipment Recommendations  Rolling walker with 5" wheels    Recommendations for Other Services       Precautions / Restrictions Precautions Precautions: Anterior Hip;Fall Restrictions Weight Bearing Restrictions: Yes LLE Weight Bearing: Weight bearing as tolerated    Mobility  Bed Mobility Overal bed mobility: Needs Assistance Bed Mobility: Supine to Sit;Sit to Supine     Supine to sit: Mod assist Sit to supine: Max assist   General bed mobility comments: Use of trapeze to scoot to edge and reposition upward in bed. Mod A for LEs and trunk to sit. Max A to supine  Transfers Overall transfer  level: Needs assistance Equipment used: Rolling walker (2 wheeled) Transfers: Sit to/from Stand Sit to Stand: Min assist         General transfer comment: Cues for safe hand placement throughout transfer, as pt reaches for several differenct placement during transfer  Ambulation/Gait Ambulation/Gait assistance: Min assist Ambulation Distance (Feet): 3 Feet Assistive device: Rolling walker (2 wheeled) Gait Pattern/deviations: Decreased step length - right;Decreased step length - left;Shuffle;Trunk flexed (pivots feet at times)     General Gait Details: Pt anxious; inconsistent steps with pivot movements, but able to demonstrate more purposeful steps with cueing. Requires cueing throughout for hand placement, as pt often removes hands from rw and attempts to place elsewhere (bed, BSC, on LLE)   Stairs            Wheelchair Mobility    Modified Rankin (Stroke Patients Only)       Balance Overall balance assessment: Needs assistance Sitting-balance support: Feet supported;Bilateral upper extremity supported Sitting balance-Leahy Scale: Fair     Standing balance support: Bilateral upper extremity supported Standing balance-Leahy Scale: Fair                      Cognition Arousal/Alertness: Awake/alert Behavior During Therapy: WFL for tasks assessed/performed Overall Cognitive Status: Within Functional Limits for tasks assessed                      Exercises Total Joint Exercises Ankle Circles/Pumps: AROM;Both;10 reps;Supine Quad Sets: Strengthening;Both;10 reps;Supine Gluteal Sets:  Strengthening;Both;10 reps;Supine    General Comments        Pertinent Vitals/Pain Pain Assessment: 0-10 Pain Score: 6  (0 at rest) Pain Location: L hip Pain Intervention(s): Limited activity within patient's tolerance;Monitored during session;Ice applied    Home Living                      Prior Function            PT Goals (current goals can now  be found in the care plan section) Progress towards PT goals: Progressing toward goals    Frequency    BID      PT Plan Current plan remains appropriate    Co-evaluation             End of Session Equipment Utilized During Treatment: Gait belt Activity Tolerance: Patient limited by fatigue;Patient limited by pain Patient left: in bed;with call bell/phone within reach;with bed alarm set;with SCD's reapplied;Other (comment) (ice applied, L hip)     Time: [id number] PT Time Calculation (min) (ACUTE ONLY): 27 min  Charges:  $Gait Training: 8-22 mins $Therapeutic Activity: 8-22 mins                    G Codes:      Scot DockHeidi E Vue Pavon, PTA 06/14/2016, 11:38 AM

## 2016-06-15 LAB — BASIC METABOLIC PANEL
Anion gap: 7 (ref 5–15)
BUN: 26 mg/dL — AB (ref 6–20)
CHLORIDE: 99 mmol/L — AB (ref 101–111)
CO2: 25 mmol/L (ref 22–32)
CREATININE: 0.81 mg/dL (ref 0.44–1.00)
Calcium: 8.2 mg/dL — ABNORMAL LOW (ref 8.9–10.3)
GFR calc Af Amer: >60 mL/min (ref >60)
GFR calc non Af Amer: >60 mL/min (ref >60)
Glucose, Bld: 126 mg/dL — ABNORMAL HIGH (ref 65–99)
Potassium: 3.9 mmol/L (ref 3.5–5.1)
SODIUM: 131 mmol/L — AB (ref 135–145)

## 2016-06-15 LAB — CBC
HCT: 30.5 % — ABNORMAL LOW (ref 35.0–47.0)
Hemoglobin: 10.2 g/dL — ABNORMAL LOW (ref 12.0–16.0)
MCH: 26.7 pg (ref 26.0–34.0)
MCHC: 33.5 g/dL (ref 32.0–36.0)
MCV: 79.8 fL — AB (ref 80.0–100.0)
PLATELETS: 203 10*3/uL (ref 150–440)
RBC: 3.82 MIL/uL (ref 3.80–5.20)
RDW: 14 % (ref 11.5–14.5)
WBC: 12.1 10*3/uL — AB (ref 3.6–11.0)

## 2016-06-15 MED ORDER — ACETAMINOPHEN 325 MG PO TABS
650.0000 mg | ORAL_TABLET | Freq: Three times a day (TID) | ORAL | Status: DC
  Administered 2016-06-15 – 2016-06-16 (×4): 650 mg via ORAL
  Filled 2016-06-15 (×3): qty 2

## 2016-06-15 MED ORDER — BUDESONIDE 0.5 MG/2ML IN SUSP
0.5000 mg | Freq: Two times a day (BID) | RESPIRATORY_TRACT | Status: DC
Start: 2016-06-15 — End: 2016-06-16
  Administered 2016-06-15 – 2016-06-16 (×3): 0.5 mg via RESPIRATORY_TRACT
  Filled 2016-06-15 (×3): qty 2

## 2016-06-15 MED ORDER — PREDNISONE 10 MG PO TABS
30.0000 mg | ORAL_TABLET | Freq: Every day | ORAL | Status: DC
  Administered 2016-06-15 – 2016-06-16 (×2): 30 mg via ORAL
  Filled 2016-06-15 (×2): qty 3

## 2016-06-15 MED ORDER — LEVOFLOXACIN 750 MG PO TABS
750.0000 mg | ORAL_TABLET | ORAL | Status: DC
  Administered 2016-06-16: 750 mg via ORAL
  Filled 2016-06-15: qty 1

## 2016-06-15 NOTE — Progress Notes (Signed)
Pharmacy Antibiotic Note  Michaelyn Barterolly G Wolters is a 82 y.o. female admitted on 06/12/2016 with pneumonia.  Pharmacy has been consulted for Levaquin dosing.  Plan: Will continue current order for Levaquin 750 mg PO q48h for Crcl 42 ml/min.    Height: 5\' 2"  (157.5 cm) Weight: 149 lb (67.6 kg) IBW/kg (Calculated) : 50.1  Temp (24hrs), Avg:98.6 F (37 C), Min:97.9 F (36.6 C), Max:99.7 F (37.6 C)   Recent Labs Lab 06/12/16 0838 06/13/16 0353 06/14/16 0404 06/15/16 0518  WBC 6.4 12.3* 12.9* 12.1*  CREATININE 0.62 0.64 0.70 0.81    Estimated Creatinine Clearance: 41.6 mL/min (by C-G formula based on SCr of 0.81 mg/dL).    No Known Allergies  Antimicrobials this admission: Cefazolin pre/post surgery 12/25 Levaquin 12/27 >>    Dose adjustments this admission:    Microbiology results:  Thank you for allowing pharmacy to be a part of this patient's care.  Marty HeckWang, Shane Badeaux L 06/15/2016 10:07 AM

## 2016-06-15 NOTE — Progress Notes (Signed)
Occupational Therapy Treatment Patient Details Name: [name] MRN: [id number] DOB: [birth year] Today's Date: 06/15/2016    History of present illness Pt is a 82 y.o. female s/p fall with L hip pain and striking back of head on refrigerator.  Pt s/p L hip hemiarthroplasty anterior approach 06/12/16 secondary to finding L displaced femoral neck fx.  PMH includes htn, THA, dementia.   OT comments  Pt educated and demonstrated LB dressing skills sitting in chair using reacher and sock aid with min assist and cues. Pt has resting tremor from medication and needed assist to compensate for decreased fine motor control with pulling sock over sock aid.  Pain 2/10 which did not increase with session.  Reviewed set up of bathroom at home and she has a shower bench and raised toilet seat.  [facility] if pneumonia has improved.  Follow Up Recommendations  SNF    Equipment Recommendations       Recommendations for Other Services      Precautions / Restrictions Precautions Precautions: Anterior Hip;Fall Restrictions Weight Bearing Restrictions: Yes LLE Weight Bearing: Weight bearing as tolerated       Mobility Bed Mobility Overal bed mobility: Needs Assistance Bed Mobility: Supine to Sit     Supine to sit: Mod assist     General bed mobility comments: Assist for LLE and trunk. Increased time   Transfers Overall transfer level: Needs assistance Equipment used: Rolling walker (2 wheeled) Transfers: Sit to/from Stand Sit to Stand: Min assist         General transfer comment: Needs cues/time to find COG and attain balance. Encouraged stand wt shift and QS/GS once balanced and before ambulation. Demonstrated from bed and BSC    Balance Overall balance assessment: Needs assistance Sitting-balance support: Feet supported;Bilateral upper extremity supported Sitting balance-Leahy Scale: Fair     Standing balance support: Bilateral  upper extremity supported Standing balance-Leahy Scale: Fair                     ADL Overall ADL's : Needs assistance/impaired                                       General ADL Comments: Pt educated and demonstrated LB dressing skills sitting in chair using reacher and sock aid with min assist and cues.  Pt has resting tremor from medication and needed assist to compensate for decreased fine motor control with pulling sock over sock aid.  Pain 2/10 which did not increase with session.  Reviewed set up of bathroom at home and she has a shower bench and raised toilet seat       Vision                     Perception     Praxis      Cognition   Behavior During Therapy: [facility] LPWFL for tasks assessed/performed Overall Cognitive Status: Within Functional Limits for tasks assessed                       Extremity/Trunk Assessment               Exercises Total Joint Exercises Ankle Circles/Pumps: AROM;Both;20 reps Quad Sets: Strengthening;Both;20 reps (long sit and also several times ea in stand) Gluteal Sets: Strengthening;Both;20 reps Heel Slides: AAROM;Left;10 reps Hip ABduction/ADduction: AAROM;Left;10  reps Straight Leg Raises: AAROM;Left;10 reps Long Arc Quad: AAROM;Left;10 reps;Seated (AROM R) Other Exercises Other Exercises: Encouraged incentive spirometer   Shoulder Instructions       General Comments      Pertinent Vitals/ Pain       Pain Assessment: 0-10 Pain Score: 2  Pain Location: L hip Pain Descriptors / Indicators: Sore;Tender;Grimacing;Operative site guarding Pain Intervention(s): Monitored during session;Premedicated before session  Home Living                                          Prior Functioning/Environment              Frequency  Min 1X/week        Progress Toward Goals  OT Goals(current goals can now be found in the care plan section)  Progress towards OT goals: Progressing  toward goals     Plan Discharge plan remains appropriate    Co-evaluation                 End of Session     Activity Tolerance Patient tolerated treatment well   Patient Left in chair;with call bell/phone within reach;with chair alarm set   Nurse Communication          Time: [id number] OT Time Calculation (min): 23 min  Charges: OT General Charges $OT Visit: 1 Procedure OT Treatments $Self Care/Home Management : 23-37 mins  Susanne BordersSusan Ismahan Lippman, OTR/L ascom [phone] 06/15/16, 5:13 PM

## 2016-06-15 NOTE — Progress Notes (Signed)
Physical Therapy Treatment Patient Details Name: [name] MRN: [id number] DOB: Nov 01, 1923 Today's Date: 06/15/2016    History of Present Illness Pt is a [age] y.o. female s/p fall with L hip pain and striking back of head on refrigerator.  Pt s/p L hip hemiarthroplasty anterior approach 06/12/16 secondary to finding L displaced femoral neck fx.  PMH includes htn, THA, dementia.    PT Comments    Pt more alert this afternoon. Agreeable to PT. Pt participates in seated and long sit exercises, toileting with set up required and short ambulation distances. Improved quality with ambulation when following cues for 3 point sequence, but inconsistent with shuffling pattern. Pt's O2 saturation improved with up out of bed to 94-95%. Continue PT to progress strength, endurance out of bed activities and quality of ambulation to improve all functional mobility.   Follow Up Recommendations  SNF     Equipment Recommendations  Rolling walker with 5" wheels    Recommendations for Other Services       Precautions / Restrictions Precautions Precautions: Anterior Hip;Fall Restrictions Weight Bearing Restrictions: Yes LLE Weight Bearing: Weight bearing as tolerated    Mobility  Bed Mobility Overal bed mobility: Needs Assistance Bed Mobility: Supine to Sit     Supine to sit: Mod assist     General bed mobility comments: Assist for LLE and trunk. Increased time   Transfers Overall transfer level: Needs assistance Equipment used: Rolling walker (2 wheeled) Transfers: Sit to/from Stand Sit to Stand: Min assist         General transfer comment: Needs cues/time to find COG and attain balance. Encouraged stand wt shift and QS/GS once balanced and before ambulation. Demonstrated from bed and BSC  Ambulation/Gait Ambulation/Gait assistance: Min guard Ambulation Distance (Feet): 10 Feet (also 4 ft to [facility]) Assistive device: Rolling walker (2 wheeled) Gait Pattern/deviations: Step-to  pattern;Decreased step length - left;Decreased step length - right;Shuffle Gait velocity: decreased Gait velocity interpretation: <1.8 ft/sec, indicative of risk for recurrent falls General Gait Details: Cues for 3 point sequence with ability to demonstrate more purposeful steps versus shuffle, but inconsistent   Stairs            Wheelchair Mobility    Modified Rankin (Stroke Patients Only)       Balance Overall balance assessment: Needs assistance Sitting-balance support: Feet supported;Bilateral upper extremity supported Sitting balance-Leahy Scale: Fair     Standing balance support: Bilateral upper extremity supported Standing balance-Leahy Scale: Fair                      Cognition Arousal/Alertness: Awake/alert Behavior During Therapy: WFL for tasks assessed/performed Overall Cognitive Status: Within Functional Limits for tasks assessed                      Exercises Total Joint Exercises Ankle Circles/Pumps: AROM;Both;20 reps Quad Sets: Strengthening;Both;20 reps (long sit and also several times ea in stand) Gluteal Sets: Strengthening;Both;20 reps Short Arc Quad: AROM;Both;20 reps;Supine Heel Slides: AAROM;Left;10 reps Hip ABduction/ADduction: AAROM;Left;10 reps Straight Leg Raises: AAROM;Left;10 reps Long Arc Quad: AAROM;Left;10 reps;Seated (AROM R) Other Exercises Other Exercises: Encouraged incentive spirometer    General Comments        Pertinent Vitals/Pain Pain Assessment: 0-10 Pain Score: 6  Pain Location: L hip Pain Intervention(s): Monitored during session;Repositioned    Home Living                      Prior Function  PT Goals (current goals can now be found in the care plan section) Progress towards PT goals: Progressing toward goals    Frequency    BID      PT Plan Current plan remains appropriate    Co-evaluation             End of Session   Activity Tolerance: Patient  tolerated treatment well;Patient limited by fatigue Patient left: in bed;with call bell/phone within reach;with bed alarm set;with SCD's reapplied     Time: 1330-1409 PT Time Calculation (min) (ACUTE ONLY): 39 min  Charges:  $Gait Training: 8-22 mins $Therapeutic Exercise: 8-22 mins $Therapeutic Activity: 8-22 mins                    G CodesScot Dock:      Heidi E Barnes, PTA 06/15/2016, 2:13 PM

## 2016-06-15 NOTE — Progress Notes (Signed)
Patient ID: [name], female   DOB: [birth year], 82 y.o.   MRN: [id number]    [facility] PROGRESS NOTE  [name]:[id number] DOB: 1924/04/02 DOA: 06/12/2016 PCP: No PCP Per Patient  HPI/Subjective: Patient feels okay. Not much pain in the hip. As per the daughter she doesn't like to take any medications. Yesterday I was concerned about the noises in her lungs and ordered a chest x-ray and possible early pneumonia was seen.  Objective: Vitals:   06/15/16 1330 06/15/16 1555  BP:  (!) 107/43  Pulse: (!) 110 91  Resp:  16  Temp:  97.5 F (36.4 C)    Filed Weights   06/12/16 0836  Weight: 67.6 kg (149 lb)    ROS: Review of Systems  Constitutional: Negative for chills and fever.  Eyes: Negative for blurred vision.  Respiratory: Negative for cough and shortness of breath.   Cardiovascular: Negative for chest pain.  Gastrointestinal: Negative for abdominal pain, constipation, diarrhea, nausea and vomiting.  Genitourinary: Negative for dysuria.  Musculoskeletal: Positive for joint pain.  Neurological: Negative for dizziness and headaches.   Exam: Physical Exam  HENT:  Nose: No mucosal edema.  Mouth/Throat: No oropharyngeal exudate or posterior oropharyngeal edema.  Eyes: Conjunctivae, EOM and lids are normal. Pupils are equal, round, and reactive to light.  Neck: No JVD present. Carotid bruit is not present. No edema present. No thyroid mass and no thyromegaly present.  Cardiovascular: S1 normal and S2 normal.  Exam reveals no gallop.   No murmur heard. Pulses:      Dorsalis pedis pulses are 2+ on the right side, and 2+ on the left side.  Respiratory: No respiratory distress. She has decreased breath sounds in the right middle field, the right lower field, the left middle field and the left lower field. She has wheezes in the right middle field, the right lower field, the left middle field and the left lower field. She has no rhonchi. She has no rales.  GI:  Soft. Bowel sounds are normal. There is no tenderness.  Musculoskeletal:       Right ankle: She exhibits swelling.       Left ankle: She exhibits swelling.  Unable to lift up left leg in the chair.  Lymphadenopathy:    She has no cervical adenopathy.  Neurological: She is alert. No cranial nerve deficit.  Skin: Skin is warm. No rash noted. Nails show no clubbing.  Psychiatric: She has a normal mood and affect.      Data Reviewed: Basic Metabolic Panel:  Recent Labs Lab 06/12/16 0838 06/13/16 0353 06/14/16 0404 06/15/16 0518  NA 132* 130* 131* 131*  K 4.1 3.7 3.4* 3.9  CL 99* 99* 99* 99*  CO2 29 27 26 25   GLUCOSE 101* 113* 113* 126*  BUN 15 12 16  26*  CREATININE 0.62 0.64 0.70 0.81  CALCIUM 9.1 8.1* 8.2* 8.2*   Liver Function Tests:  Recent Labs Lab 06/12/16 0838  AST 18  ALT 16  ALKPHOS 74  BILITOT 0.6  PROT 6.5  ALBUMIN 3.7   CBC:  Recent Labs Lab 06/12/16 0838 06/13/16 0353 06/14/16 0404 06/15/16 0518  WBC 6.4 12.3* 12.9* 12.1*  NEUTROABS 3.6  --   --   --   HGB 12.4 10.4* 10.4* 10.2*  HCT 36.5 31.6* 31.4* 30.5*  MCV 79.9* 80.1 79.6* 79.8*  PLT 258 222 211 203     Studies: Dg Chest 1 View  Result Date: 06/14/2016 CLINICAL DATA:  Cough. EXAM: CHEST 1 VIEW COMPARISON:  06/12/2016 and older studies FINDINGS: Moderate enlargement of the cardiopericardial silhouette. No mediastinal or hilar masses. No convincing adenopathy. Streaky opacity is noted in both lung bases. This is mildly increased, particularly on the right, when compared to the prior study. There is some mild hazy associated lung base opacity. Findings may be due to a combination of chronic bronchitic change in atelectasis. Pneumonia is possible. Remainder of the lungs is essentially clear. No obvious pleural effusion. No pneumothorax. Skeletal structures are demineralized but grossly intact. IMPRESSION: 1. Lung base opacity, right greater than left, mildly increased when compared to the  prior study. This may reflect a combination of chronic bronchitic change in atelectasis. A component of pneumonia is suspected, particularly on the right, however, given the symptoms of cough. Electronically Signed   By: Maria Portlandavid  Jordan M.D.   On: 06/14/2016 14:56   [place] Venous Img Lower Unilateral Left  Result Date: 06/14/2016 CLINICAL DATA:  Left lower extremity edema. Positive Homans sign. History of left hip surgery. Evaluate for DVT. EXAM: LEFT LOWER EXTREMITY VENOUS DOPPLER ULTRASOUND TECHNIQUE: Gray-scale sonography with graded compression, as well as color Doppler and duplex ultrasound were performed to evaluate the lower extremity deep venous systems from the level of the common femoral vein and including the common femoral, femoral, profunda femoral, popliteal and calf veins including the posterior tibial, peroneal and gastrocnemius veins when visible. The superficial great saphenous vein was also interrogated. Spectral Doppler was utilized to evaluate flow at rest and with distal augmentation maneuvers in the common femoral, femoral and popliteal veins. COMPARISON:  None. FINDINGS: Contralateral Common Femoral Vein: Respiratory phasicity is normal and symmetric with the symptomatic side. No evidence of thrombus. Normal compressibility. Common Femoral Vein: No evidence of thrombus. Normal compressibility, respiratory phasicity and response to augmentation. Saphenofemoral Junction: No evidence of thrombus. Normal compressibility and flow on color Doppler imaging. Profunda Femoral Vein: No evidence of thrombus. Normal compressibility and flow on color Doppler imaging. Femoral Vein: No evidence of thrombus. Normal compressibility, respiratory phasicity and response to augmentation. Popliteal Vein: No evidence of thrombus. Normal compressibility, respiratory phasicity and response to augmentation. Calf Veins: No evidence of thrombus. Normal compressibility and flow on color Doppler imaging. Superficial Great  Saphenous Vein: No evidence of thrombus. Normal compressibility and flow on color Doppler imaging. Venous Reflux:  None. Other Findings:  None. IMPRESSION: No evidence of DVT within the left lower extremity. Electronically Signed   By: Simonne ComeJohn  Watts M.D.   On: 06/14/2016 10:44    Scheduled Meds: . acetaminophen  650 mg Oral TID  . aspirin EC  81 mg Oral Daily  . budesonide (PULMICORT) nebulizer solution  0.5 mg Nebulization BID  . calcium carbonate  1 tablet Oral Daily  . docusate sodium  100 mg Oral BID  . donepezil  10 mg Oral QHS  . enoxaparin (LOVENOX) injection  40 mg Subcutaneous Q24H  . ipratropium-albuterol  3 mL Nebulization Q6H  . [START ON 06/16/2016] levofloxacin  750 mg Oral Q48H  .  morphine injection  4 mg Intravenous Once  . multivitamin-lutein  1 capsule Oral Daily  . pantoprazole  40 mg Oral Daily  . predniSONE  30 mg Oral Q breakfast  . sertraline  25 mg Oral Daily  . vitamin B-12  1,000 mcg Oral Daily    Assessment/Plan:  1. Pneumonia with asthmatic bronchitis component. Start by mouth prednisone. Budesonide and DuoNeb nebulizers. Levaquin high dose started yesterday dosed every 48 hours. Watch  in the hospital another day until we get better breath sounds in the lungs. 2. Essential hypertension. Hold Norvasc since blood pressure on the lower side 3. Mild dementia on Aricept 4. GERD on Protonix 5. Left hip fracture requiring operative repair. Placed on standing dose Tylenol for now. Physical therapy recommends rehabilitation.   Code Status:     Code Status Orders        Start     Ordered   06/12/16 1255  Do not attempt resuscitation (DNR)  Continuous    Question Answer Comment  In the event of cardiac or respiratory ARREST Do not call a "code blue"   In the event of cardiac or respiratory ARREST Do not perform Intubation, CPR, defibrillation or ACLS   In the event of cardiac or respiratory ARREST Use medication by any route, position, wound care, and other  measures to relive pain and suffering. May use oxygen, suction and manual treatment of airway obstruction as needed for comfort.      06/12/16 1254    Code Status History    Date Active Date Inactive Code Status Order ID Comments User Context   06/12/2016 11:12 AM 06/12/2016 12:54 PM Full Code [id number]  Kennedy BuckerMichael Menz, MD ED    Advance Directive Documentation   Flowsheet Row Most Recent Value  Type of Advance Directive  Healthcare Power of Attorney  Pre-existing out of facility DNR order (yellow form or pink MOST form)  Yellow form placed in chart (order not valid for inpatient use)  "MOST" Form in Place?  No data     Family Communication: Daughter on the phone Disposition Plan: We'll go at the rehabilitation once moving better air  Consultants:  Orthopedic surgery  Procedures: -  left hip hemiarthroplasty  Time spent: 24 minutes  Alford HighlandWIETING, Ashanty Coltrane  [employer]

## 2016-06-15 NOTE — Care Management Important Message (Signed)
Important Message  Patient Details  Name: [name] MRN: [id number] Date of Birth: June 30, 1923   Medicare Important Message Given:  Yes    Marily MemosLisa M Loran Fleet, RN 06/15/2016, 9:37 AM

## 2016-06-15 NOTE — Progress Notes (Signed)
Physical Therapy Treatment Patient Details Name: [name] MRN: [id number] DOB: 10-23-1923 Today's Date: 06/15/2016    History of Present Illness Pt is a 82 y.o. female s/p fall with L hip pain and striking back of head on refrigerator.  Pt s/p L hip hemiarthroplasty anterior approach 06/12/16 secondary to finding L displaced femoral neck fx.  PMH includes htn, THA, dementia.    PT Comments    Pt initially sleeping; awoken through voice and touch. Agreeable to PT; "I'll try". Pt notes 5/10 pain with movement in Left lower extremity. Requires active assisted movement throughout Bilateral lower extremities this session for most exercises due to increased level of lethargy and weakness. Deferred out of bed activity at this time. Continue PT this afternoon for hopefull progression of strength, endurance and out of bed tolerance to improve all function.   Follow Up Recommendations  SNF     Equipment Recommendations  Rolling walker with 5" wheels    Recommendations for Other Services       Precautions / Restrictions Precautions Precautions: Anterior Hip;Fall Restrictions Weight Bearing Restrictions: Yes LLE Weight Bearing: Weight bearing as tolerated    Mobility  Bed Mobility               General bed mobility comments: Not tested due to level of lethargy; pt wishes to remain in bed  Transfers                    Ambulation/Gait                 Stairs            Wheelchair Mobility    Modified Rankin (Stroke Patients Only)       Balance                                    Cognition Arousal/Alertness: Lethargic Behavior During Therapy: WFL for tasks assessed/performed Overall Cognitive Status: Within Functional Limits for tasks assessed                      Exercises Total Joint Exercises Ankle Circles/Pumps: AROM;Both;20 reps;Supine Quad Sets: Strengthening;Both;10 reps;Supine Gluteal Sets:  Strengthening;Both;10 reps;Supine Short Arc Quad: AROM;Both;20 reps;Supine Heel Slides: AAROM;20 reps;Supine;Both Hip ABduction/ADduction: AAROM;20 reps;Supine;Both Other Exercises Other Exercises: Incentive spirometer 10x    General Comments        Pertinent Vitals/Pain Pain Assessment: 0-10 (With movement) Pain Score: 5  Pain Location: L hip Pain Intervention(s): Limited activity within patient's tolerance;Monitored during session    Home Living                      Prior Function            PT Goals (current goals can now be found in the care plan section) Progress towards PT goals: Progressing toward goals (slowly; limited by pneumonia)    Frequency    BID      PT Plan Current plan remains appropriate    Co-evaluation             End of Session   Activity Tolerance: Patient limited by lethargy;Patient limited by fatigue Patient left: in bed;with call bell/phone within reach;with bed alarm set;with SCD's reapplied     Time: [id number] PT Time Calculation (min) (ACUTE ONLY): 16 min  Charges:  $Therapeutic Exercise: 8-22 mins  G CodesScot Dock:      Valentina Alcoser E Christine Morton, PTA 06/15/2016, 11:53 AM

## 2016-06-15 NOTE — Progress Notes (Addendum)
  Subjective: 3 Days Post-Op Procedure(s) (LRB): left hip hemiarthroplasty anterior approach (Left) Patient reports pain as mild.   Patient is well, and has had no acute complaints or problems PT to work with patient, care management to assist with discharge.  Currently recommending SNF. Negative for chest pain and shortness of breath Fever: no Gastrointestinal:Negative for nausea and vomiting  Objective: Vital signs in last 24 hours: Temp:  [97.9 F (36.6 C)-99.7 F (37.6 C)] 97.9 F (36.6 C) (12/28 0540) Pulse Rate:  [75-97] 97 (12/28 0540) Resp:  [18-19] 19 (12/28 0540) BP: (86-121)/(42-61) 104/52 (12/28 0543) SpO2:  [91 %-93 %] 92 % (12/28 0540)  Intake/Output from previous day:  Intake/Output Summary (Last 24 hours) at 06/15/16 0718 Last data filed at 06/15/16 0547  Gross per 24 hour  Intake              390 ml  Output              510 ml  Net             -120 ml    Intake/Output this shift: No intake/output data recorded.  Labs:  Recent Labs  06/12/16 0838 06/13/16 0353 06/14/16 0404 06/15/16 0518  HGB 12.4 10.4* 10.4* 10.2*    Recent Labs  06/14/16 0404 06/15/16 0518  WBC 12.9* 12.1*  RBC 3.94 3.82  HCT 31.4* 30.5*  PLT 211 203    Recent Labs  06/14/16 0404 06/15/16 0518  NA 131* 131*  K 3.4* 3.9  CL 99* 99*  CO2 26 25  BUN 16 26*  CREATININE 0.70 0.81  GLUCOSE 113* 126*  CALCIUM 8.2* 8.2*   No results for input(s): LABPT, INR in the last 72 hours.   EXAM General - Patient is Alert, Appropriate and Oriented Extremity - ABD soft Sensation intact distally Intact pulses distally Dorsiflexion/Plantar flexion intact Incision: scant drainage No cellulitis present Dressing/Incision - blood tinged drainage, minimal at the distal aspect of the incision. Motor Function - intact, moving foot and toes well on exam.  Abd soft, non-tender with normal BS.  Past Medical History:  Diagnosis Date  . Dementia   . Hypertension   . Macular  degeneration    Assessment/Plan: 3 Days Post-Op Procedure(s) (LRB): left hip hemiarthroplasty anterior approach (Left) Principal Problem:   Hip fracture (HCC)  Estimated body mass index is 27.25 kg/m as calculated from the following:   Height as of this encounter: 5\' 2"  (1.575 m).   Weight as of this encounter: 67.6 kg (149 lb). Advance diet Up with therapy   Labs reviewed, WBC down to 12.1, no fevers this AM.  Being treated for pneumonia on levaquin.   US of the left leg negative for DVT. Na 131, internal medicine treating for fluid overload. Up with therapy today.  Pt can be discharge to SNF when medically appropriate. Staples can be removed by facility on 06/26/16, [facility] weeks for x-rays of the left hip.  DVT Prophylaxis - Lovenox, Foot Pumps and TED hose Weight-Bearing as tolerated to left leg  J. Horris LatinoLance Marylin Lathon, PA-C [facility] 06/15/2016, 7:18 AM

## 2016-06-16 DIAGNOSIS — M6281 Muscle weakness (generalized): Secondary | ICD-10-CM | POA: Diagnosis not present

## 2016-06-16 DIAGNOSIS — J45909 Unspecified asthma, uncomplicated: Secondary | ICD-10-CM | POA: Diagnosis not present

## 2016-06-16 DIAGNOSIS — F0390 Unspecified dementia without behavioral disturbance: Secondary | ICD-10-CM | POA: Diagnosis not present

## 2016-06-16 DIAGNOSIS — R2689 Other abnormalities of gait and mobility: Secondary | ICD-10-CM | POA: Diagnosis not present

## 2016-06-16 DIAGNOSIS — J189 Pneumonia, unspecified organism: Secondary | ICD-10-CM | POA: Diagnosis not present

## 2016-06-16 DIAGNOSIS — Z9181 History of falling: Secondary | ICD-10-CM | POA: Diagnosis not present

## 2016-06-16 DIAGNOSIS — Z7982 Long term (current) use of aspirin: Secondary | ICD-10-CM | POA: Diagnosis not present

## 2016-06-16 DIAGNOSIS — S72002D Fracture of unspecified part of neck of left femur, subsequent encounter for closed fracture with routine healing: Secondary | ICD-10-CM | POA: Diagnosis not present

## 2016-06-16 DIAGNOSIS — F329 Major depressive disorder, single episode, unspecified: Secondary | ICD-10-CM | POA: Diagnosis not present

## 2016-06-16 DIAGNOSIS — H3530 Unspecified macular degeneration: Secondary | ICD-10-CM | POA: Diagnosis not present

## 2016-06-16 DIAGNOSIS — I10 Essential (primary) hypertension: Secondary | ICD-10-CM | POA: Diagnosis not present

## 2016-06-16 DIAGNOSIS — K219 Gastro-esophageal reflux disease without esophagitis: Secondary | ICD-10-CM | POA: Diagnosis not present

## 2016-06-16 DIAGNOSIS — Z96642 Presence of left artificial hip joint: Secondary | ICD-10-CM | POA: Diagnosis not present

## 2016-06-16 LAB — URINALYSIS, COMPLETE (UACMP) WITH MICROSCOPIC
Bilirubin Urine: NEGATIVE
GLUCOSE, UA: NEGATIVE mg/dL
HGB URINE DIPSTICK: NEGATIVE
KETONES UR: NEGATIVE mg/dL
LEUKOCYTES UA: NEGATIVE
Nitrite: NEGATIVE
PH: 6 (ref 5.0–8.0)
Protein, ur: NEGATIVE mg/dL
RBC / HPF: NONE SEEN RBC/hpf (ref 0–5)
Specific Gravity, Urine: 1.012 (ref 1.005–1.030)

## 2016-06-16 MED ORDER — LEVOFLOXACIN 750 MG PO TABS: 750.0000 mg | ORAL_TABLET | ORAL | 0 refills | Status: DC

## 2016-06-16 MED ORDER — IPRATROPIUM-ALBUTEROL 0.5-2.5 (3) MG/3ML IN SOLN: 3.0000 mL | Freq: Four times a day (QID) | RESPIRATORY_TRACT | 0 refills | Status: DC

## 2016-06-16 MED ORDER — PREDNISONE 5 MG PO TABS: ORAL_TABLET | ORAL | 0 refills | Status: DC

## 2016-06-16 MED ORDER — ACETAMINOPHEN 325 MG PO TABS: 650.0000 mg | ORAL_TABLET | Freq: Three times a day (TID) | ORAL | 0 refills | Status: DC

## 2016-06-16 MED ORDER — BUDESONIDE 0.5 MG/2ML IN SUSP: 0.5000 mg | Freq: Two times a day (BID) | RESPIRATORY_TRACT | 0 refills | Status: DC

## 2016-06-16 MED ORDER — ENOXAPARIN SODIUM 40 MG/0.4ML ~~LOC~~ SOLN: 40.0000 mg | SUBCUTANEOUS | 0 refills | Status: DC

## 2016-06-16 MED ORDER — DOCUSATE SODIUM 100 MG PO CAPS: 100.0000 mg | ORAL_CAPSULE | Freq: Two times a day (BID) | ORAL | 0 refills | Status: AC

## 2016-06-16 MED ORDER — OXYCODONE HCL 5 MG PO TABS: 5.0000 mg | ORAL_TABLET | Freq: Four times a day (QID) | ORAL | 0 refills | Status: DC | PRN

## 2016-06-16 NOTE — Progress Notes (Signed)
  Subjective: 4 Days Post-Op Procedure(s) (LRB): left hip hemiarthroplasty anterior approach (Left) Patient reports pain as mild.   Patient is well, and has had no acute complaints or problems PT to work with patient, care management to assist with discharge.  Currently recommending SNF. Negative for chest pain and shortness of breath Fever: no Gastrointestinal:Negative for nausea and vomiting  Objective: Vital signs in last 24 hours: Temp:  [97.5 F (36.4 C)-98 F (36.7 C)] 98 F (36.7 C) (12/29 0517) Pulse Rate:  [80-110] 80 (12/29 0517) Resp:  [16-19] 19 (12/29 0517) BP: (107-138)/(43-58) 138/58 (12/29 0517) SpO2:  [91 %-100 %] 93 % (12/29 0517)  Intake/Output from previous day:  Intake/Output Summary (Last 24 hours) at 06/16/16 0751 Last data filed at 06/16/16 0550  Gross per 24 hour  Intake              240 ml  Output              950 ml  Net             -710 ml    Intake/Output this shift: No intake/output data recorded.  Labs:  Recent Labs  06/14/16 0404 06/15/16 0518  HGB 10.4* 10.2*    Recent Labs  06/14/16 0404 06/15/16 0518  WBC 12.9* 12.1*  RBC 3.94 3.82  HCT 31.4* 30.5*  PLT 211 203    Recent Labs  06/14/16 0404 06/15/16 0518  NA 131* 131*  K 3.4* 3.9  CL 99* 99*  CO2 26 25  BUN 16 26*  CREATININE 0.70 0.81  GLUCOSE 113* 126*  CALCIUM 8.2* 8.2*   No results for input(s): LABPT, INR in the last 72 hours.   EXAM General - Patient is Alert, Appropriate and Oriented Extremity - ABD soft Sensation intact distally Intact pulses distally Dorsiflexion/Plantar flexion intact Incision: scant drainage No cellulitis present Dressing/Incision - blood tinged drainage, minimal at the distal aspect of the incision. Motor Function - intact, moving foot and toes well on exam.  Abd soft, non-tender with normal BS.  Past Medical History:  Diagnosis Date  . Dementia   . Hypertension   . Macular degeneration    Assessment/Plan: 4 Days  Post-Op Procedure(s) (LRB): left hip hemiarthroplasty anterior approach (Left) Principal Problem:   Hip fracture (HCC)  Estimated body mass index is 27.25 kg/m as calculated from the following:   Height as of this encounter: 5\' 2"  (1.575 m).   Weight as of this encounter: 67.6 kg (149 lb). Advance diet Up with therapy   Labs reviewed, WBC down to 12.1, no fevers this AM.  Being treated for pneumonia on levaquin.   US of the left leg negative for DVT. Na 131, internal medicine treating for fluid overload. Up with therapy today.  Pt can be discharge to SNF when medically appropriate. Staples can be removed by facility on 06/26/16, [facility] 6 weeks for x-rays of the left hip.  DVT Prophylaxis - Lovenox, Foot Pumps and TED hose Weight-Bearing as tolerated to left leg  J. Horris LatinoLance Perris Tripathi, PA-C [facility] 06/16/2016, 7:51 AM

## 2016-06-16 NOTE — Progress Notes (Signed)
[facility]. RN will call report at [phone] and arrange EMS for transport. Per [facility] Team case manager authorization has been updated for today. Auth # [id number]. Clinical Child psychotherapistocial Worker (CSW) sent [facility]C [facility]. Patient is aware of above. Patient's daughter Boneta LucksJenny is at bedside and aware of above. Please reconsult if future social work needs arise. CSW signing off.   [employer]Bailey Caydyn Sprung, LCSW [phone]) [phone]

## 2016-06-16 NOTE — Discharge Summary (Signed)
[employer] - [place] at [facility]   PATIENT NAME: [name]    MR#:  [id number]  DATE OF BIRTH:  Feb 05, 1924  DATE OF ADMISSION:  06/12/2016 ADMITTING PHYSICIAN: Kennedy Bucker, MD  DATE OF DISCHARGE: 06/16/2016  PRIMARY CARE PHYSICIAN: Dr Dorothey Baseman   ADMISSION DIAGNOSIS:  Fracture [T14.8XXA] Trauma [T14.90XA] Closed fracture of left hip, initial encounter (HCC) [S72.002A]  DISCHARGE DIAGNOSIS:  Principal Problem:   Hip fracture (HCC)   SECONDARY DIAGNOSIS:   Past Medical History:  Diagnosis Date  . Dementia   . Hypertension   . Macular degeneration     HOSPITAL COURSE:   1. Left hip fracture closed requiring operative repair. Please see operative report. Patient will be transferred to rehabilitation today. Patient has some soreness there. I did order some standing dose Tylenol because she will likely not take the pain medication. Can make the Tylenol when necessary in about 5 days. Please see orthopedic discharge instructions about staple removal and follow-up appointment. Lovenox prescribed 14 days for DVT prophylaxis 2. Pneumonia with asthmatic bronchitis component. High-dose Levaquin given. I cannot tell if this was hospital acquired or happen before coming in the hospital. Chest x-ray was not done on admission. I did prescribe budesonide and DuoNeb nebulizers and needed prednisone in order to get the lungs sounding better. She still has a little expiratory wheeze at the bases but not complaining of any respiratory symptoms. Continue the nebulizer treatments at the facility. Finish up course of Levaquin and prednisone taper. 3. Essential hypertension. Blood pressure satisfactory off medication 4. Mild dementia on Aricept 5. GERD on omeprazole  DISCHARGE CONDITIONS:   Satisfactory  CONSULTS OBTAINED:  Orthopedic surgery  DRUG ALLERGIES:  No Known Allergies  DISCHARGE MEDICATIONS:   Current Discharge Medication List    START taking these  medications   Details  acetaminophen (TYLENOL) 325 MG tablet Take 2 tablets (650 mg total) by mouth 3 (three) times daily. Qty: 30 tablet, Refills: 0    budesonide (PULMICORT) 0.5 MG/2ML nebulizer solution Take 2 mLs (0.5 mg total) by nebulization 2 (two) times daily. Qty: 120 mL, Refills: 0    docusate sodium (COLACE) 100 MG capsule Take 1 capsule (100 mg total) by mouth 2 (two) times daily. Qty: 60 capsule, Refills: 0    enoxaparin (LOVENOX) 40 MG/0.4ML injection Inject 0.4 mLs (40 mg total) into the skin daily. Qty: 14 Syringe, Refills: 0    ipratropium-albuterol (DUONEB) 0.5-2.5 (3) MG/3ML SOLN Take 3 mLs by nebulization every 6 (six) hours. Qty: 360 mL, Refills: 0    levofloxacin (LEVAQUIN) 750 MG tablet Take 1 tablet (750 mg total) by mouth every other day. Qty: 2 tablet, Refills: 0    oxyCODONE (OXY IR/ROXICODONE) 5 MG immediate release tablet Take 1 tablet (5 mg total) by mouth every 6 (six) hours as needed for severe pain. Qty: 10 tablet, Refills: 0    predniSONE (DELTASONE) 5 MG tablet 5 tabs po day1; 4 tabs po day2; 3 tabs po day3; 2 tabs po day4 1 tab po day5 Qty: 15 tablet, Refills: 0      CONTINUE these medications which have NOT CHANGED   Details  aspirin EC 81 MG tablet Take 81 mg by mouth daily.    Coenzyme Q10 (CO Q 10) 60 MG CAPS Take 1 capsule by mouth daily.    donepezil (ARICEPT) 10 MG tablet Take 1 tablet by mouth at bedtime.    Multiple Vitamins-Minerals (OCUVITE ADULT 50+) CAPS Take 1 capsule by mouth daily.  sertraline (ZOLOFT) 25 MG tablet Take 25 mg by mouth daily.    vitamin B-12 (CYANOCOBALAMIN) 1000 MCG tablet Take 1 tablet by mouth daily.    Calcium Carbonate Antacid (TUMS PO) Take 1 tablet by mouth daily.    omeprazole (PRILOSEC) 20 MG capsule Take 20 mg by mouth daily.      STOP taking these medications     metoprolol (LOPRESSOR) 50 MG tablet          DISCHARGE INSTRUCTIONS:   Follow-up with Dr. at rehabilitation one  day  If you experience worsening of your admission symptoms, develop shortness of breath, life threatening emergency, suicidal or homicidal thoughts you must seek medical attention immediately by calling 911 or calling your MD immediately  if symptoms less severe.  You Must read complete instructions/literature along with all the possible adverse reactions/side effects for all the Medicines you take and that have been prescribed to you. Take any new Medicines after you have completely understood and accept all the possible adverse reactions/side effects.   Please note  You were cared for by a hospitalist during your hospital stay. If you have any questions about your discharge medications or the care you received while you were in the hospital after you are discharged, you can call the unit and asked to speak with the hospitalist on call if the hospitalist that took care of you is not available. Once you are discharged, your primary care physician will handle any further medical issues. Please note that NO REFILLS for any discharge medications will be authorized once you are discharged, as it is imperative that you return to your primary care physician (or establish a relationship with a primary care physician if you do not have one) for your aftercare needs so that they can reassess your need for medications and monitor your lab values.    Today   CHIEF COMPLAINT:   Chief Complaint  Patient presents with  . Fall  . Hip Pain    HISTORY OF PRESENT ILLNESS:  [name]  is a 82 y.o. female presented with a fall and found to have a hip fracture   VITAL SIGNS:  Blood pressure (!) 120/59, pulse 87, temperature 97.4 F (36.3 C), temperature source Oral, resp. rate 19, height 5\' 2"  (1.575 m), weight 67.6 kg (149 lb), SpO2 97 %.   PHYSICAL EXAMINATION:  GENERAL:  82 y.o.-year-old patient lying in the bed with no acute distress.  EYES: Pupils equal, round, reactive to light and accommodation.  No scleral icterus. Extraocular muscles intact.  HEENT: Head atraumatic, normocephalic. Oropharynx and nasopharynx clear.  NECK:  Supple, no jugular venous distention. No thyroid enlargement, no tenderness.  LUNGS: Normal breath sounds bilaterally, Expiratory wheezing at bases. No rales,rhonchi or crepitation. No use of accessory muscles of respiration.  CARDIOVASCULAR: S1, S2 normal. . 3/6 systolic murmurs, no rubs, or gallops.  ABDOMEN: Soft, non-tender, non-distended. Bowel sounds present. No organomegaly or mass.  EXTREMITIES: No pedal edema, cyanosis, or clubbing.  NEUROLOGIC: Cranial nerves II through XII are intact. Muscle strength 5/5 in all extremities. Sensation intact. Gait not checked.  PSYCHIATRIC: The patient is alert and oriented x 3.  SKIN: No obvious rash, lesion, or ulcer.   DATA REVIEW:   CBC  Recent Labs Lab 06/15/16 0518  WBC 12.1*  HGB 10.2*  HCT 30.5*  PLT 203    Chemistries   Recent Labs Lab 06/12/16 0838  06/15/16 0518  NA 132*  < > 131*  K 4.1  < >  3.9  CL 99*  < > 99*  CO2 29  < > 25  GLUCOSE 101*  < > 126*  BUN 15  < > 26*  CREATININE 0.62  < > 0.81  CALCIUM 9.1  < > 8.2*  AST 18  --   --   ALT 16  --   --   ALKPHOS 74  --   --   BILITOT 0.6  --   --   < > = values in this interval not displayed.    RADIOLOGY:  Dg Chest 1 View  Result Date: 06/14/2016 CLINICAL DATA:  Cough. EXAM: CHEST 1 VIEW COMPARISON:  06/12/2016 and older studies FINDINGS: Moderate enlargement of the cardiopericardial silhouette. No mediastinal or hilar masses. No convincing adenopathy. Streaky opacity is noted in both lung bases. This is mildly increased, particularly on the right, when compared to the prior study. There is some mild hazy associated lung base opacity. Findings may be due to a combination of chronic bronchitic change in atelectasis. Pneumonia is possible. Remainder of the lungs is essentially clear. No obvious pleural effusion. No pneumothorax.  Skeletal structures are demineralized but grossly intact. IMPRESSION: 1. Lung base opacity, right greater than left, mildly increased when compared to the prior study. This may reflect a combination of chronic bronchitic change in atelectasis. A component of pneumonia is suspected, particularly on the right, however, given the symptoms of cough. Electronically Signed   By: Amie Portlandavid  Ormond M.D.   On: 06/14/2016 14:56   [place] Venous Img Lower Unilateral Left  Result Date: 06/14/2016 CLINICAL DATA:  Left lower extremity edema. Positive Homans sign. History of left hip surgery. Evaluate for DVT. EXAM: LEFT LOWER EXTREMITY VENOUS DOPPLER ULTRASOUND TECHNIQUE: Gray-scale sonography with graded compression, as well as color Doppler and duplex ultrasound were performed to evaluate the lower extremity deep venous systems from the level of the common femoral vein and including the common femoral, femoral, profunda femoral, popliteal and calf veins including the posterior tibial, peroneal and gastrocnemius veins when visible. The superficial great saphenous vein was also interrogated. Spectral Doppler was utilized to evaluate flow at rest and with distal augmentation maneuvers in the common femoral, femoral and popliteal veins. COMPARISON:  None. FINDINGS: Contralateral Common Femoral Vein: Respiratory phasicity is normal and symmetric with the symptomatic side. No evidence of thrombus. Normal compressibility. Common Femoral Vein: No evidence of thrombus. Normal compressibility, respiratory phasicity and response to augmentation. Saphenofemoral Junction: No evidence of thrombus. Normal compressibility and flow on color Doppler imaging. Profunda Femoral Vein: No evidence of thrombus. Normal compressibility and flow on color Doppler imaging. Femoral Vein: No evidence of thrombus. Normal compressibility, respiratory phasicity and response to augmentation. Popliteal Vein: No evidence of thrombus. Normal compressibility,  respiratory phasicity and response to augmentation. Calf Veins: No evidence of thrombus. Normal compressibility and flow on color Doppler imaging. Superficial Great Saphenous Vein: No evidence of thrombus. Normal compressibility and flow on color Doppler imaging. Venous Reflux:  None. Other Findings:  None. IMPRESSION: No evidence of DVT within the left lower extremity. Electronically Signed   By: Simonne ComeJohn  Watts M.D.   On: 06/14/2016 10:44    Management plans discussed with the patient, family and they are in agreement.  CODE STATUS:     Code Status Orders        Start     Ordered   06/12/16 1255  Do not attempt resuscitation (DNR)  Continuous    Question Answer Comment  In the event of cardiac or  respiratory ARREST Do not call a "code blue"   In the event of cardiac or respiratory ARREST Do not perform Intubation, CPR, defibrillation or ACLS   In the event of cardiac or respiratory ARREST Use medication by any route, position, wound care, and other measures to relive pain and suffering. May use oxygen, suction and manual treatment of airway obstruction as needed for comfort.      06/12/16 1254    Code Status History    Date Active Date Inactive Code Status Order ID Comments User Context   06/12/2016 11:12 AM 06/12/2016 12:54 PM Full Code [id number]  Kennedy Bucker, MD ED    Advance Directive Documentation   Flowsheet Row Most Recent Value  Type of Advance Directive  Healthcare Power of Attorney  Pre-existing out of facility DNR order (yellow form or pink MOST form)  Yellow form placed in chart (order not valid for inpatient use)  "MOST" Form in Place?  No data      TOTAL TIME TAKING CARE OF THIS PATIENT: 35 minutes.    Alford Highland M.D on 06/16/2016 at 9:38 AM  Between 7am to 6pm - Pager - [phone]  After 6pm go to [email or web address] - password [email or web address]  [employer] Office  [phone]  CC: Primary care physician; Dr Dorothey Baseman

## 2016-06-16 NOTE — Progress Notes (Signed)
EMS has arrived to transport patient to facility, patient on bedside commode using restroom with NT at bedside- NO IV found upon assessment for removal prior to discharge; previous documentation states that the IV was removed, but no date and time was documented. Report given to EMS.

## 2016-06-16 NOTE — Clinical Social Work Placement (Signed)
   CLINICAL SOCIAL WORK PLACEMENT  NOTE  Date:  06/16/2016  Patient Details  Name: [name] G Pete MRN: [id number] Date of Birth: Dec 29, 1923  Clinical Social Work is seeking post-discharge placement for this patient at the Skilled  Nursing Facility level of care (*CSW will initial, date and re-position this form in  chart as items are completed):  Yes   Patient/family provided with [place] Clinical Social Work Department's list of facilities offering this level of care within the geographic area requested by the patient (or if unable, by the patient's family).  Yes   Patient/family informed of their freedom to choose among providers that offer the needed level of care, that participate in Medicare, Medicaid or managed care program needed by the patient, have an available bed and are willing to accept the patient.  Yes   Patient/family informed of 's ownership interest in [facility] and [facility], as well as of the fact that they are under no obligation to receive care at these facilities.  PASRR submitted to EDS on       PASRR number received on       Existing PASRR number confirmed on 06/13/16     FL2 transmitted to all facilities in geographic area requested by pt/family on 06/13/16     FL2 transmitted to all facilities within larger geographic area on       Patient informed that his/her managed care company has contracts with or will negotiate with certain facilities, including the following:        Yes   Patient/family informed of bed offers received.  Patient chooses bed at  [facility] )     Physician recommends and patient chooses bed at      Patient to be transferred to  [facility] ) on 06/16/16.  Patient to be transferred to facility by  [facility] )     Patient family notified on 06/16/16 of transfer.  Name of family member notified:   (Patient's daughter Boneta LucksJenny is aware of D/C today. )     PHYSICIAN       Additional  Comment:    _______________________________________________ Sania Noy, Darleen CrockerBailey M, LCSW 06/16/2016, 12:43 PM

## 2016-06-16 NOTE — Progress Notes (Signed)
Report called to Coy Saunasosemary, Facilities managerurse Manager at [facility] [phone]). Patient going to room 348; per Ms. Coy Saunasosemary, patient's room is ready. Non-emergent transport called to transport patient.

## 2016-06-16 NOTE — Progress Notes (Signed)
Physical Therapy Treatment Patient Details Name: [name] MRN: [id number] DOB: [birth year] Today's Date: 06/16/2016    History of Present Illness Pt is a 82 y.o. female s/p fall with L hip pain and striking back of head on refrigerator.  Pt s/p L hip hemiarthroplasty anterior approach 06/12/16 secondary to finding L displaced femoral neck fx.  PMH includes htn, THA, dementia.    PT Comments    Ms. Mcnerney was very pleasant and motivated to work with PT today.  She [facility]  She ambulated 50 ft with RW and min guard assist as pt fatigues quickly and requires one standing break.  She remains appropriate for SNF at d/c. Pt will benefit from continued skilled PT services to increase functional independence and safety.   Follow Up Recommendations  SNF     Equipment Recommendations  Rolling walker with 5" wheels    Recommendations for Other Services       Precautions / Restrictions Precautions Precautions: Anterior Hip;Fall Restrictions Weight Bearing Restrictions: Yes LLE Weight Bearing: Weight bearing as tolerated    Mobility  Bed Mobility Overal bed mobility: Needs Assistance Bed Mobility: Supine to Sit     Supine to sit: Min guard;HOB elevated     General bed mobility comments: Increased time and effort.  Pt with heavy use of bed rail with HOB elevated.  Transfers Overall transfer level: Needs assistance Equipment used: Rolling walker (2 wheeled) Transfers: Sit to/from Stand Sit to Stand: Min assist         General transfer comment: Cues for hand placement and technique.  Pt requires assist to boost up to standing and to steady.   Ambulation/Gait Ambulation/Gait assistance: Min guard Ambulation Distance (Feet): 50 Feet Assistive device: Rolling walker (2 wheeled) Gait Pattern/deviations: Step-to pattern;Shuffle;Decreased stance time - left;Decreased step length - right;Decreased weight shift to left;Trunk flexed Gait  velocity: decreased Gait velocity interpretation: <1.8 ft/sec, indicative of risk for recurrent falls General Gait Details: Cues for upright posture and forward gaze and for taking larger steps.  Pt requires one standing rest break.   Stairs            Wheelchair Mobility    Modified Rankin (Stroke Patients Only)       Balance Overall balance assessment: Needs assistance;History of Falls Sitting-balance support: Feet supported;Bilateral upper extremity supported Sitting balance-Leahy Scale: Fair     Standing balance support: Bilateral upper extremity supported Standing balance-Leahy Scale: Poor Standing balance comment: Relies on support of RW for static standing                    Cognition Arousal/Alertness: Awake/alert Behavior During Therapy: WFL for tasks assessed/performed Overall Cognitive Status: History of cognitive impairments - at baseline                      Exercises Total Joint Exercises Ankle Circles/Pumps: AROM;Both;Supine;10 reps Quad Sets: Strengthening;Both;10 reps;Supine Gluteal Sets: Strengthening;Both;10 reps;Supine Straight Leg Raises: Left;5 reps;Strengthening;Supine Long Arc Quad: Left;10 reps;Seated;Strengthening    General Comments        Pertinent Vitals/Pain Pain Assessment: Faces Faces Pain Scale: Hurts little more Pain Location: L hip Pain Descriptors / Indicators: Grimacing;Guarding;Moaning Pain Intervention(s): Limited activity within patient's tolerance;Monitored during session;Repositioned    Home Living                      Prior Function  PT Goals (current goals can now be found in the care plan section) Acute Rehab PT Goals Patient Stated Goal: to go to rehab and get home PT Goal Formulation: With patient Time For Goal Achievement: 06/27/16 Potential to Achieve Goals: Good Progress towards PT goals: Progressing toward goals    Frequency    BID      PT Plan Current  plan remains appropriate    Co-evaluation             End of Session Equipment Utilized During Treatment: Gait belt Activity Tolerance: Patient tolerated treatment well;Patient limited by fatigue Patient left: with call bell/phone within reach;in chair;with chair alarm set     Time: [id number] PT Time Calculation (min) (ACUTE ONLY): 28 min  Charges:  $Gait Training: 8-22 mins $Therapeutic Exercise: 8-22 mins                    G Codes:      Encarnacion ChuAshley Rilley Stash PT, DPT 06/16/2016, 10:12 AM

## 2016-06-16 NOTE — Progress Notes (Signed)
Pharmacy Antibiotic Note  [name] is a 82 y.o. female admitted on 06/12/2016 with pneumonia.  Pharmacy has been consulted for Levaquin dosing.  Plan: Continue levaquin 750mg  PO Q48H    Height: 5\' 2"  (157.5 cm) Weight: 149 lb (67.6 kg) IBW/kg (Calculated) : 50.1  Temp (24hrs), Avg:97.7 F (36.5 C), Min:97.4 F (36.3 C), Max:98 F (36.7 C)   Recent Labs Lab 06/12/16 0838 06/13/16 0353 06/14/16 0404 06/15/16 0518  WBC 6.4 12.3* 12.9* 12.1*  CREATININE 0.62 0.64 0.70 0.81    Estimated Creatinine Clearance: 41.6 mL/min (by C-G formula based on SCr of 0.81 mg/dL).    No Known Allergies  Antimicrobials this admission: Cefazolin pre/post surgery 12/25 Levaquin 12/27 >>       >>    Dose adjustments this admission:    Microbiology results:   BCx:     UCx:      Sputum:      MRSA PCR:   12/27 CXR: PNA  Thank you for allowing pharmacy to be a part of this patient's care.  Dorin Stooksbury C 06/16/2016 11:05 AM

## 2016-06-16 NOTE — Discharge Instructions (Signed)
° °  Hip Fracture A hip fracture is a fracture of the upper part of your thigh bone (femur). What are the causes? A hip fracture is caused by a direct blow to the side of your hip. This is usually the result of a fall but can occur in other circumstances, such as an automobile accident. What increases the risk? There is an increased risk of hip fractures in people with:  An unsteady walking pattern (gait) and those with conditions that contribute to poor balance, such as Parkinson's disease or dementia.  Osteopenia and osteoporosis.  Cancer that spreads to the leg bones.  Certain metabolic diseases. What are the signs or symptoms? Symptoms of hip fracture include:  Pain over the injured hip.  Inability to put weight on the leg in which the fracture occurred (although, some patients are able to walk after a hip fracture).  Toes and foot of the affected leg point outward when you lie down. How is this diagnosed? A physical exam can determine if a hip fracture is likely to have occurred. X-ray exams are needed to confirm the fracture and to look for other injuries. The X-ray exam can help to determine the type of hip fracture. Rarely, the fracture is not visible on an X-ray image and a CT scan or MRI will have to be done. How is this treated? The treatment for a fracture is usually surgery. This means using a screw, nail, or rod to hold the bones in place. Follow these instructions at home: Take all medicines as directed by your health care provider. Contact a health care provider if: Pain continues, even after taking pain medicine. This information is not intended to replace advice given to you by your health care provider. Make sure you discuss any questions you have with your health care provider. Document Released: 06/05/2005 Document Revised: 11/11/2015 Document Reviewed: 01/15/2013 Elsevier Interactive Patient Education  2017 [employer] [place] can be removed by facility on  06/26/16 Follow-up [facility] Ros[facility]Menz [facility] in 6 weeks for x-rays of the left hip.

## 2016-06-19 ENCOUNTER — Encounter
Admission: RE | Admit: 2016-06-19 | Discharge: 2016-06-19 | Disposition: A | Discharge status: Home / Self Care | Payer: PPO | Source: Ambulatory Visit | Attending: Internal Medicine | Admitting: Internal Medicine

## 2016-06-19 DIAGNOSIS — R1110 Vomiting, unspecified: Secondary | ICD-10-CM | POA: Insufficient documentation

## 2016-06-19 DIAGNOSIS — J189 Pneumonia, unspecified organism: Secondary | ICD-10-CM | POA: Diagnosis not present

## 2016-06-19 DIAGNOSIS — S72002D Fracture of unspecified part of neck of left femur, subsequent encounter for closed fracture with routine healing: Secondary | ICD-10-CM | POA: Diagnosis not present

## 2016-06-19 DIAGNOSIS — K219 Gastro-esophageal reflux disease without esophagitis: Secondary | ICD-10-CM | POA: Diagnosis not present

## 2016-06-19 DIAGNOSIS — M6281 Muscle weakness (generalized): Secondary | ICD-10-CM | POA: Diagnosis not present

## 2016-06-19 DIAGNOSIS — H3530 Unspecified macular degeneration: Secondary | ICD-10-CM | POA: Diagnosis not present

## 2016-06-19 DIAGNOSIS — R41841 Cognitive communication deficit: Secondary | ICD-10-CM | POA: Diagnosis not present

## 2016-06-19 DIAGNOSIS — R509 Fever, unspecified: Secondary | ICD-10-CM | POA: Insufficient documentation

## 2016-06-19 DIAGNOSIS — F329 Major depressive disorder, single episode, unspecified: Secondary | ICD-10-CM | POA: Diagnosis not present

## 2016-06-19 DIAGNOSIS — F0390 Unspecified dementia without behavioral disturbance: Secondary | ICD-10-CM | POA: Diagnosis not present

## 2016-06-19 DIAGNOSIS — R197 Diarrhea, unspecified: Secondary | ICD-10-CM | POA: Insufficient documentation

## 2016-06-19 DIAGNOSIS — Z9181 History of falling: Secondary | ICD-10-CM | POA: Diagnosis not present

## 2016-06-19 DIAGNOSIS — R2689 Other abnormalities of gait and mobility: Secondary | ICD-10-CM | POA: Diagnosis not present

## 2016-06-19 DIAGNOSIS — Z7982 Long term (current) use of aspirin: Secondary | ICD-10-CM | POA: Diagnosis not present

## 2016-06-19 DIAGNOSIS — I10 Essential (primary) hypertension: Secondary | ICD-10-CM | POA: Diagnosis not present

## 2016-06-19 DIAGNOSIS — Z96642 Presence of left artificial hip joint: Secondary | ICD-10-CM | POA: Diagnosis not present

## 2016-06-19 DIAGNOSIS — J45909 Unspecified asthma, uncomplicated: Secondary | ICD-10-CM | POA: Diagnosis not present

## 2016-06-20 ENCOUNTER — Other Ambulatory Visit: Payer: Self-pay

## 2016-06-20 DIAGNOSIS — J189 Pneumonia, unspecified organism: Secondary | ICD-10-CM | POA: Diagnosis not present

## 2016-06-20 DIAGNOSIS — J45901 Unspecified asthma with (acute) exacerbation: Secondary | ICD-10-CM | POA: Diagnosis not present

## 2016-06-20 DIAGNOSIS — M810 Age-related osteoporosis without current pathological fracture: Secondary | ICD-10-CM | POA: Diagnosis not present

## 2016-06-20 DIAGNOSIS — F0390 Unspecified dementia without behavioral disturbance: Secondary | ICD-10-CM | POA: Diagnosis not present

## 2016-06-20 NOTE — Patient Outreach (Addendum)
[facility] Care Management  06/20/2016  [name] 1923-07-17 [id number]     EMMI-General Discharge RED ON EMMI ALERT Day # 1 Date: 06/19/16 Red Alert Reason: "Got discharge papers? No" "Know who to call about changes in conditions? No" "Scheduled follow up appt? No" "Unfilled prescriptions? Yes"   Outreach attempt #1 to patient. Spoke with patient. HIPAA verified. Patient reported her current correct address as: [address] [name]. Reviewed and addressed red alert. Patient reports she responded that way as she is currently not handling her own medical affairs while she is getting rehab. Confirmed with patient that [facility] and [facility]. She reports that she will be there for at least another 2-4wks getting therapy. Patient states therapy going well. Advised patient that RN CM would have EMMI General Discharge calls discontinued since she was not sent home but to a facility. She voiced understanding and was appreciative of f/u call.     Plan: RN CM will notify [facility] administrative assistant of case closure, address update and to deactivate EMMI calls.   Antionette Fairyoshanda Tiffney Haughton, RN,BSN,CCM [facility] Care Management Telephonic Care Management Coordinator Direct Phone: [phone] Toll Free: [phone] Fax: [phone]

## 2016-07-02 DIAGNOSIS — R509 Fever, unspecified: Secondary | ICD-10-CM | POA: Diagnosis not present

## 2016-07-02 DIAGNOSIS — R197 Diarrhea, unspecified: Secondary | ICD-10-CM | POA: Diagnosis not present

## 2016-07-02 DIAGNOSIS — R1110 Vomiting, unspecified: Secondary | ICD-10-CM | POA: Diagnosis not present

## 2016-07-02 LAB — INFLUENZA PANEL BY PCR (TYPE A & B)
INFLAPCR: NEGATIVE
Influenza B By PCR: NEGATIVE

## 2016-07-03 ENCOUNTER — Non-Acute Institutional Stay (SKILLED_NURSING_FACILITY): Payer: PPO | Admitting: Gerontology

## 2016-07-03 DIAGNOSIS — S72002D Fracture of unspecified part of neck of left femur, subsequent encounter for closed fracture with routine healing: Secondary | ICD-10-CM | POA: Diagnosis not present

## 2016-07-12 DIAGNOSIS — F329 Major depressive disorder, single episode, unspecified: Secondary | ICD-10-CM | POA: Diagnosis not present

## 2016-07-12 DIAGNOSIS — W19XXXD Unspecified fall, subsequent encounter: Secondary | ICD-10-CM | POA: Diagnosis not present

## 2016-07-12 DIAGNOSIS — Z96642 Presence of left artificial hip joint: Secondary | ICD-10-CM | POA: Diagnosis not present

## 2016-07-12 DIAGNOSIS — I10 Essential (primary) hypertension: Secondary | ICD-10-CM | POA: Diagnosis not present

## 2016-07-12 DIAGNOSIS — S72002D Fracture of unspecified part of neck of left femur, subsequent encounter for closed fracture with routine healing: Secondary | ICD-10-CM | POA: Diagnosis not present

## 2016-07-12 DIAGNOSIS — F0390 Unspecified dementia without behavioral disturbance: Secondary | ICD-10-CM | POA: Diagnosis not present

## 2016-07-12 DIAGNOSIS — Z9181 History of falling: Secondary | ICD-10-CM | POA: Diagnosis not present

## 2016-07-12 DIAGNOSIS — Z7982 Long term (current) use of aspirin: Secondary | ICD-10-CM | POA: Diagnosis not present

## 2016-07-12 DIAGNOSIS — H3530 Unspecified macular degeneration: Secondary | ICD-10-CM | POA: Diagnosis not present

## 2016-07-18 NOTE — Progress Notes (Signed)
Location:      Place of Service:  SNF (31)  Provider: Lorenso Quarry, NP-C  PCP: Dorothey Baseman, MD Patient Care Team: Dorothey Baseman, MD as PCP - General (Family Medicine)  Extended Emergency Contact Information Primary Emergency Contact: Lehman Prom Address: [address]          [place], [place] [address] [place] Home Phone: [phone] Relation: Daughter Secondary Emergency Contact: Nyoka Lint [place] Home Phone: [phone] Relation: Son  Code Status: DNR Goals of care:  Advanced Directive information Advanced Directives 06/12/2016  Does Patient Have a Medical Advance Directive? Yes  Type of Advance Directive Healthcare Power of Attorney  Does patient want to make changes to medical advance directive? No - Patient declined  Copy of Healthcare Power of Attorney in Chart? No - copy requested  Pre-existing out of facility DNR order (yellow form or pink MOST form) -     No Known Allergies  Chief Complaint  Patient presents with  . Discharge Note    HPI:  83 y.o. female seen today for discharge evaluation. Pt was admitted to the facility for rehab following admission at [facility] for left Hip fracture with surgical intervention. Pt has done well with pain control. She has worked well with therapy. Pt denies n/v/d/f/c/cp/sob/ha/abd pain/dizziness. Pt denies pain at this time. She reports she is feeling well, good appetite and recent BM. Pt feels she is ready to discharge home to an Marketing executive. VSS. No other complaints.      Past Medical History:  Diagnosis Date  . Dementia   . Hypertension   . Macular degeneration     Past Surgical History:  Procedure Laterality Date  . TOTAL HIP ARTHROPLASTY    . TOTAL HIP ARTHROPLASTY Left 06/12/2016   Procedure: left hip hemiarthroplasty anterior approach;  Surgeon: Kennedy Bucker, MD;  Location: [facility] ORS;  Service: Orthopedics;  Laterality: Left;      reports that she has  never smoked. She has never used smokeless tobacco. Her alcohol and drug histories are not on file. Social History   Social History  . Marital status: Married    Spouse name: N/A  . Number of children: N/A  . Years of education: N/A   Occupational History  . Not on file.   Social History Main Topics  . Smoking status: Never Smoker  . Smokeless tobacco: Never Used  . Alcohol use Not on file  . Drug use: Unknown  . Sexual activity: Not on file   Other Topics Concern  . Not on file   Social History Narrative  . No narrative on file   Functional Status Survey:    No Known Allergies  Pertinent  Health Maintenance Due  Topic Date Due  . DEXA SCAN  06/08/1991  . PNA vac Low Risk Adult (1 of 2 - PCV13) 06/08/1991  . INFLUENZA VACCINE  01/18/2016    Medications: Allergies as of 07/03/2016   No Known Allergies     Medication List       Accurate as of 07/03/16 11:59 PM. Always use your most recent med list.          acetaminophen 325 MG tablet Commonly known as:  TYLENOL Take 2 tablets (650 mg total) by mouth 3 (three) times daily.   aspirin EC 81 MG tablet Take 81 mg by mouth daily.   budesonide 0.5 MG/2ML nebulizer solution Commonly known as:  PULMICORT Take 2 mLs (0.5 mg total) by nebulization 2 (two)  times daily.   Co Q 10 60 MG Caps Take 1 capsule by mouth daily.   docusate sodium 100 MG capsule Commonly known as:  COLACE Take 1 capsule (100 mg total) by mouth 2 (two) times daily.   donepezil 10 MG tablet Commonly known as:  ARICEPT Take 1 tablet by mouth at bedtime.   enoxaparin 40 MG/0.4ML injection Commonly known as:  LOVENOX Inject 0.4 mLs (40 mg total) into the skin daily.   ipratropium-albuterol 0.5-2.5 (3) MG/3ML Soln Commonly known as:  DUONEB Take 3 mLs by nebulization every 6 (six) hours.   levofloxacin 750 MG tablet Commonly known as:  LEVAQUIN Take 1 tablet (750 mg total) by mouth every other day.   OCUVITE ADULT 50+ Caps Take 1  capsule by mouth daily.   omeprazole 20 MG capsule Commonly known as:  PRILOSEC Take 20 mg by mouth daily.   oxyCODONE 5 MG immediate release tablet Commonly known as:  Oxy IR/ROXICODONE Take 1 tablet (5 mg total) by mouth every 6 (six) hours as needed for severe pain.   predniSONE 5 MG tablet Commonly known as:  DELTASONE 5 tabs po day1; 4 tabs po day2; 3 tabs po day3; 2 tabs po day4 1 tab po day5   sertraline 25 MG tablet Commonly known as:  ZOLOFT Take 25 mg by mouth daily.   TUMS PO Take 1 tablet by mouth daily.   vitamin B-12 1000 MCG tablet Commonly known as:  CYANOCOBALAMIN Take 1 tablet by mouth daily.       Review of Systems  Constitutional: Negative for activity change, appetite change, chills, diaphoresis and fever.  HENT: Negative for congestion, sneezing, sore throat, trouble swallowing and voice change.   Eyes: Negative for pain, redness and visual disturbance.  Respiratory: Negative for apnea, cough, choking, chest tightness, shortness of breath and wheezing.   Cardiovascular: Negative for chest pain, palpitations and leg swelling.  Gastrointestinal: Negative for abdominal distention, abdominal pain, constipation, diarrhea and nausea.  Genitourinary: Negative for difficulty urinating, dysuria, frequency and urgency.  Musculoskeletal: Positive for arthralgias (typical arthritis) and gait problem. Negative for back pain and myalgias.  Skin: Positive for wound. Negative for color change, pallor and rash.  Neurological: Negative for dizziness, tremors, syncope, speech difficulty, weakness, numbness and headaches.  Psychiatric/Behavioral: Negative for agitation and behavioral problems.  All other systems reviewed and are negative.   Vitals:   07/03/16 0600  BP: 139/68  Pulse: 79  Resp: 17  Temp: 97.9 F (36.6 C)  SpO2: 94%  Weight: 149 lb 4.8 oz (67.7 kg)   Body mass index is 27.31 kg/m. Physical Exam  Constitutional: She is oriented to person, place,  and time. Vital signs are normal. She appears well-developed and well-nourished. She is active and cooperative. She does not appear ill. No distress.  HENT:  Head: Normocephalic and atraumatic.  Mouth/Throat: Uvula is midline, oropharynx is clear and moist and mucous membranes are normal. Mucous membranes are not pale, not dry and not cyanotic.  Eyes: Conjunctivae, EOM and lids are normal. Pupils are equal, round, and reactive to light.  Neck: Trachea normal, normal range of motion and full passive range of motion without pain. Neck supple. No JVD present. No tracheal deviation, no edema and no erythema present. No thyromegaly present.  Cardiovascular: Normal rate, regular rhythm, normal heart sounds, intact distal pulses and normal pulses.  Exam reveals no gallop, no distant heart sounds and no friction rub.   No murmur heard. Pulmonary/Chest: Effort normal and  breath sounds normal. No accessory muscle usage. No respiratory distress. She has no wheezes. She has no rales. She exhibits no tenderness.  Abdominal: Normal appearance and bowel sounds are normal. She exhibits no distension and no ascites. There is no tenderness.  Musculoskeletal: She exhibits no edema.       Left hip: She exhibits decreased range of motion, tenderness and laceration (incision).  Expected osteoarthritis, stiffness. Calves soft, supple, negative Homan's sign  Neurological: She is alert and oriented to person, place, and time. She has normal strength.  Skin: Skin is warm and dry. Laceration (Left hip incision) noted. She is not diaphoretic. No cyanosis. No pallor. Nails show no clubbing.  Psychiatric: She has a normal mood and affect. Her speech is normal and behavior is normal. Judgment and thought content normal. Cognition and memory are normal.  Nursing note and vitals reviewed.   Labs reviewed: Basic Metabolic Panel:  Recent Labs  16/10/96 0353 06/14/16 0404 06/15/16 0518  NA 130* 131* 131*  K 3.7 3.4* 3.9    CL 99* 99* 99*  CO2 27 26 25   GLUCOSE 113* 113* 126*  BUN 12 16 26*  CREATININE 0.64 0.70 0.81  CALCIUM 8.1* 8.2* 8.2*   Liver Function Tests:  Recent Labs  06/12/16 0838  AST 18  ALT 16  ALKPHOS 74  BILITOT 0.6  PROT 6.5  ALBUMIN 3.7   No results for input(s): LIPASE, AMYLASE in the last 8760 hours. No results for input(s): AMMONIA in the last 8760 hours. CBC:  Recent Labs  06/12/16 0838 06/13/16 0353 06/14/16 0404 06/15/16 0518  WBC 6.4 12.3* 12.9* 12.1*  NEUTROABS 3.6  --   --   --   HGB 12.4 10.4* 10.4* 10.2*  HCT 36.5 31.6* 31.4* 30.5*  MCV 79.9* 80.1 79.6* 79.8*  PLT 258 222 211 203   Cardiac Enzymes: No results for input(s): CKTOTAL, CKMB, CKMBINDEX, TROPONINI in the last 8760 hours. BNP: Invalid input(s): POCBNP CBG: No results for input(s): GLUCAP in the last 8760 hours.  Procedures and Imaging Studies During Stay: No results found.  Assessment/Plan:   1. Fracture of unspecified part of neck of left femur, subsequent encounter for closed fracture with routine healing  Continue Oxycodone 5 mg 1 tablet po Q 6 hours prn pain # 20, no refill  Continue Tylenol 650 mg po TID scheduled for pain  Ice to the hip prn  COntinue PT/OT exercises  ASA 81 mg po Q day  FU with orthopedist, PCP, asap for continuity of care    Patient is being discharged with the following home health services:  HHPT/OT  Patient is being discharged with the following durable medical equipment:  none  Patient has been advised to f/u with their PCP in 1-2 weeks to bring them up to date on their rehab stay.  Social services at facility was responsible for arranging this appointment.  Pt was provided with a 30 day supply of prescriptions for medications and refills must be obtained from their PCP.  For controlled substances, a more limited supply may be provided adequate until PCP appointment only.  Future labs/tests needed:    Family/ staff Communication:   Total  Time:  Documentation:  Face to Face:  Family/Phone:  Brynda Rim, NP-C [facility] [address] [place], [place] [address] Cell Phone (Mon-Fri 8am-5pm):  [phone] On Call:  [phone] & follow prompts after 5pm & weekends Office Phone:  [phone] Office Fax:  [phone]

## 2016-07-19 DIAGNOSIS — F0390 Unspecified dementia without behavioral disturbance: Secondary | ICD-10-CM | POA: Diagnosis not present

## 2016-07-19 DIAGNOSIS — Z7982 Long term (current) use of aspirin: Secondary | ICD-10-CM | POA: Diagnosis not present

## 2016-07-19 DIAGNOSIS — Z9181 History of falling: Secondary | ICD-10-CM | POA: Diagnosis not present

## 2016-07-19 DIAGNOSIS — Z96642 Presence of left artificial hip joint: Secondary | ICD-10-CM | POA: Diagnosis not present

## 2016-07-19 DIAGNOSIS — H3530 Unspecified macular degeneration: Secondary | ICD-10-CM | POA: Diagnosis not present

## 2016-07-19 DIAGNOSIS — S72002D Fracture of unspecified part of neck of left femur, subsequent encounter for closed fracture with routine healing: Secondary | ICD-10-CM | POA: Diagnosis not present

## 2016-07-19 DIAGNOSIS — W19XXXD Unspecified fall, subsequent encounter: Secondary | ICD-10-CM | POA: Diagnosis not present

## 2016-07-19 DIAGNOSIS — F329 Major depressive disorder, single episode, unspecified: Secondary | ICD-10-CM | POA: Diagnosis not present

## 2016-07-19 DIAGNOSIS — I10 Essential (primary) hypertension: Secondary | ICD-10-CM | POA: Diagnosis not present

## 2016-07-24 DIAGNOSIS — Z96649 Presence of unspecified artificial hip joint: Secondary | ICD-10-CM | POA: Diagnosis not present

## 2016-07-25 DIAGNOSIS — H3530 Unspecified macular degeneration: Secondary | ICD-10-CM | POA: Diagnosis not present

## 2016-07-25 DIAGNOSIS — S72002D Fracture of unspecified part of neck of left femur, subsequent encounter for closed fracture with routine healing: Secondary | ICD-10-CM | POA: Diagnosis not present

## 2016-07-25 DIAGNOSIS — F329 Major depressive disorder, single episode, unspecified: Secondary | ICD-10-CM | POA: Diagnosis not present

## 2016-07-25 DIAGNOSIS — F0390 Unspecified dementia without behavioral disturbance: Secondary | ICD-10-CM | POA: Diagnosis not present

## 2016-07-25 DIAGNOSIS — I10 Essential (primary) hypertension: Secondary | ICD-10-CM | POA: Diagnosis not present

## 2016-07-25 DIAGNOSIS — Z7982 Long term (current) use of aspirin: Secondary | ICD-10-CM | POA: Diagnosis not present

## 2016-07-25 DIAGNOSIS — Z9181 History of falling: Secondary | ICD-10-CM | POA: Diagnosis not present

## 2016-07-25 DIAGNOSIS — Z96642 Presence of left artificial hip joint: Secondary | ICD-10-CM | POA: Diagnosis not present

## 2016-07-25 DIAGNOSIS — W19XXXD Unspecified fall, subsequent encounter: Secondary | ICD-10-CM | POA: Diagnosis not present

## 2016-07-28 DIAGNOSIS — I10 Essential (primary) hypertension: Secondary | ICD-10-CM | POA: Diagnosis not present

## 2016-07-28 DIAGNOSIS — M1990 Unspecified osteoarthritis, unspecified site: Secondary | ICD-10-CM | POA: Diagnosis not present

## 2016-07-28 DIAGNOSIS — R413 Other amnesia: Secondary | ICD-10-CM | POA: Diagnosis not present

## 2016-07-28 DIAGNOSIS — I4891 Unspecified atrial fibrillation: Secondary | ICD-10-CM | POA: Diagnosis not present

## 2016-08-09 DIAGNOSIS — R710 Precipitous drop in hematocrit: Secondary | ICD-10-CM | POA: Diagnosis not present

## 2016-08-18 DIAGNOSIS — H35373 Puckering of macula, bilateral: Secondary | ICD-10-CM | POA: Diagnosis not present

## 2016-08-18 DIAGNOSIS — H43813 Vitreous degeneration, bilateral: Secondary | ICD-10-CM | POA: Diagnosis not present

## 2016-08-18 DIAGNOSIS — H353211 Exudative age-related macular degeneration, right eye, with active choroidal neovascularization: Secondary | ICD-10-CM | POA: Diagnosis not present

## 2016-08-18 DIAGNOSIS — H353122 Nonexudative age-related macular degeneration, left eye, intermediate dry stage: Secondary | ICD-10-CM | POA: Diagnosis not present

## 2016-10-30 DIAGNOSIS — R413 Other amnesia: Secondary | ICD-10-CM | POA: Diagnosis not present

## 2016-10-30 DIAGNOSIS — I10 Essential (primary) hypertension: Secondary | ICD-10-CM | POA: Diagnosis not present

## 2016-11-15 DIAGNOSIS — H43813 Vitreous degeneration, bilateral: Secondary | ICD-10-CM | POA: Diagnosis not present

## 2016-11-15 DIAGNOSIS — H353123 Nonexudative age-related macular degeneration, left eye, advanced atrophic without subfoveal involvement: Secondary | ICD-10-CM | POA: Diagnosis not present

## 2016-11-15 DIAGNOSIS — H353211 Exudative age-related macular degeneration, right eye, with active choroidal neovascularization: Secondary | ICD-10-CM | POA: Diagnosis not present

## 2016-11-15 DIAGNOSIS — H35373 Puckering of macula, bilateral: Secondary | ICD-10-CM | POA: Diagnosis not present

## 2016-11-28 DIAGNOSIS — H353232 Exudative age-related macular degeneration, bilateral, with inactive choroidal neovascularization: Secondary | ICD-10-CM | POA: Diagnosis not present

## 2017-03-14 ENCOUNTER — Ambulatory Visit (INDEPENDENT_AMBULATORY_CARE_PROVIDER_SITE_OTHER): Payer: PPO | Admitting: General Surgery

## 2017-03-14 ENCOUNTER — Encounter: Payer: Self-pay | Admitting: General Surgery

## 2017-03-14 VITALS — BP 132/68 | HR 62 | Resp 20 | Ht 59.0 in | Wt 149.0 lb

## 2017-03-14 DIAGNOSIS — R1013 Epigastric pain: Secondary | ICD-10-CM | POA: Diagnosis not present

## 2017-03-14 NOTE — Patient Instructions (Addendum)
Will order CBC, CMP, lipase and right upper quadrant ultrasound. Will contact patient regarding results.  Gallbladder ultrasound is scheduled at [facility] on Tuesday, 03-20-17 at 9:30 am. Please arrive at 9:15 am. Prep: nothing to eat or drink after midnight.

## 2017-03-14 NOTE — Progress Notes (Signed)
Patient ID: [name], female   DOB: 1924-03-22, 83 y.o.   MRN: [id number]  Chief Complaint  Patient presents with  . Abdominal Pain    HPI [name] is a 83 y.o. female here today for an evaluation of epigastric abdominal pain with a feeling of fullness. Abdominal pain started about a week ago and was worse 4 days ago. Reports it starts after eating breakfast but has noticed it even without eating. History of GERD, HTN. History is predominantly from daughter and limited due to patient's dementia.   Patient's daughter states she was checked out by EMS yesterday morning because the patient thought she was having a heart attack. No nausea or vomiting.   HPI  Past Medical History:  Diagnosis Date  . Dementia   . GERD (gastroesophageal reflux disease)   . Hypertension   . Macular degeneration     Past Surgical History:  Procedure Laterality Date  . ABDOMINAL HYSTERECTOMY    . COLONOSCOPY    . TOTAL HIP ARTHROPLASTY    . TOTAL HIP ARTHROPLASTY Left 06/12/2016   Procedure: left hip hemiarthroplasty anterior approach;  Surgeon: Kennedy Bucker, MD;  Location: [facility] ORS;  Service: Orthopedics;  Laterality: Left;    Family History  Problem Relation Age of Onset  . Stroke Mother     Social History Social History  Substance Use Topics  . Smoking status: Never Smoker  . Smokeless tobacco: Never Used  . Alcohol use Not on file    Allergies  Allergen Reactions  . Atorvastatin Other (See Comments)    Current Outpatient Prescriptions  Medication Sig Dispense Refill  . acetaminophen (TYLENOL) 325 MG tablet Take 2 tablets (650 mg total) by mouth 3 (three) times daily. 30 tablet 0  . aspirin EC 81 MG tablet Take 81 mg by mouth daily.    . Calcium Carbonate Antacid (TUMS PO) Take 1 tablet by mouth daily.    Marland Kitchen docusate sodium (COLACE) 100 MG capsule Take 1 capsule (100 mg total) by mouth 2 (two) times daily. 60 capsule 0  . levofloxacin (LEVAQUIN) 750 MG tablet Take 1 tablet (750  mg total) by mouth every other day. 2 tablet 0  . metoprolol tartrate (LOPRESSOR) 50 MG tablet     . Multiple Vitamins-Minerals (OCUVITE ADULT 50+) CAPS Take 1 capsule by mouth daily.    Marland Kitchen omeprazole (PRILOSEC) 20 MG capsule Take 20 mg by mouth daily.    . vitamin B-12 (CYANOCOBALAMIN) 1000 MCG tablet Take 1 tablet by mouth daily.     No current facility-administered medications for this visit.     Review of Systems Review of Systems  Constitutional: Negative.   Cardiovascular: Negative.   Gastrointestinal: Positive for abdominal pain. Negative for abdominal distention, constipation, diarrhea, nausea and vomiting.    Blood pressure 132/68, pulse 62, resp. rate 20, height  (1.499 m), weight 149 lb (67.6 kg).  Physical Exam Physical Exam  Constitutional: She is oriented to person, place, and time. She appears well-developed and well-nourished.  Eyes: Conjunctivae are normal. No scleral icterus.  Neck: Neck supple.  Cardiovascular: Normal rate, regular rhythm and normal heart sounds.   Pulmonary/Chest: Effort normal and breath sounds normal. No respiratory distress.  Abdominal: Soft. Bowel sounds are normal. There is tenderness.  Mild tenderness in RUQ  Neurological: She is alert and oriented to person, place, and time.  Skin: Skin is warm and dry.  Psychiatric: She has a normal mood and affect. Her behavior is normal.  Data Reviewed None  Assessment    Epigastric abdominal discomfort and tightness that lasted almost a full day. Not clear how often this has happened. She was seen by EMS yesterday morning and ruled out cardiac source.  Possible biliary source.    Plan    Will order CBC, CMP, and lipase today in clinic and schedule for a RUQ [place]. Further plan based on labs and [place]      HPI, Physical Exam, Assessment and Plan have been scribed under the direction and in the presence of Kathreen Cosier, MD  Ples Specter, CMA  I have completed the exam and reviewed the  above documentation for accuracy and completeness.  I agree with the above.  Museum/gallery conservator has been used and any errors in dictation or transcription are unintentional.  Seeplaputhur G. Evette Cristal, M.D., F.A.C.S.   Gerlene Burdock G 03/14/2017, 1:27 PM  Patient has been scheduled for a gallbladder ultrasound at [facility] for 03-20-17 at 9:30 am (arrive 9:15 am). Prep: NPO after midnight. This patient is aware of date, time, and instructions. She verbalizes understanding.   Nicholes Mango, CMA

## 2017-03-15 ENCOUNTER — Telehealth: Payer: Self-pay | Admitting: *Deleted

## 2017-03-15 LAB — COMPREHENSIVE METABOLIC PANEL
A/G RATIO: 1.5 (ref 1.2–2.2)
ALBUMIN: 4 g/dL (ref 3.2–4.6)
ALT: 8 IU/L (ref 0–32)
AST: 11 IU/L (ref 0–40)
Alkaline Phosphatase: 83 IU/L (ref 39–117)
BILIRUBIN TOTAL: 0.3 mg/dL (ref 0.0–1.2)
BUN / CREAT RATIO: 20 (ref 12–28)
BUN: 18 mg/dL (ref 10–36)
CHLORIDE: 98 mmol/L (ref 96–106)
CO2: 26 mmol/L (ref 20–29)
Calcium: 9.7 mg/dL (ref 8.7–10.3)
Creatinine, Ser: 0.88 mg/dL (ref 0.57–1.00)
GFR calc non Af Amer: 58 mL/min/{1.73_m2} — ABNORMAL LOW (ref >59)
GFR, EST AFRICAN AMERICAN: 67 mL/min/{1.73_m2} (ref >59)
GLOBULIN, TOTAL: 2.6 g/dL (ref 1.5–4.5)
GLUCOSE: 94 mg/dL (ref 65–99)
Potassium: 4.9 mmol/L (ref 3.5–5.2)
Sodium: 138 mmol/L (ref 134–144)
TOTAL PROTEIN: 6.6 g/dL (ref 6.0–8.5)

## 2017-03-15 LAB — CBC WITH DIFFERENTIAL/PLATELET
BASOS: 0 %
Basophils Absolute: 0 10*3/uL (ref 0.0–0.2)
EOS (ABSOLUTE): 0.2 10*3/uL (ref 0.0–0.4)
Eos: 2 %
HEMATOCRIT: 39.5 % (ref 34.0–46.6)
HEMOGLOBIN: 12.6 g/dL (ref 11.1–15.9)
Immature Grans (Abs): 0 10*3/uL (ref 0.0–0.1)
Immature Granulocytes: 0 %
LYMPHS: 34 %
Lymphocytes Absolute: 2.8 10*3/uL (ref 0.7–3.1)
MCH: 25.9 pg — AB (ref 26.6–33.0)
MCHC: 31.9 g/dL (ref 31.5–35.7)
MCV: 81 fL (ref 79–97)
MONOS ABS: 0.6 10*3/uL (ref 0.1–0.9)
Monocytes: 7 %
NEUTROS PCT: 57 %
Neutrophils Absolute: 4.6 10*3/uL (ref 1.4–7.0)
Platelets: 327 10*3/uL (ref 150–379)
RBC: 4.86 x10E6/uL (ref 3.77–5.28)
RDW: 14.5 % (ref 12.3–15.4)
WBC: 8.2 10*3/uL (ref 3.4–10.8)

## 2017-03-15 LAB — LIPASE: Lipase: 25 U/L (ref 14–85)

## 2017-03-15 NOTE — Telephone Encounter (Signed)
Patient's daughter [name] called to find out about her Mother's lab results that she had done yesterday. Please advise. Thank you

## 2017-03-19 DIAGNOSIS — R413 Other amnesia: Secondary | ICD-10-CM | POA: Diagnosis not present

## 2017-03-19 DIAGNOSIS — Z23 Encounter for immunization: Secondary | ICD-10-CM | POA: Diagnosis not present

## 2017-03-19 DIAGNOSIS — I10 Essential (primary) hypertension: Secondary | ICD-10-CM | POA: Diagnosis not present

## 2017-03-20 ENCOUNTER — Ambulatory Visit
Admission: RE | Admit: 2017-03-20 | Discharge: 2017-03-20 | Disposition: A | Discharge status: Home / Self Care | Payer: PPO | Source: Ambulatory Visit | Attending: General Surgery | Admitting: General Surgery

## 2017-03-20 DIAGNOSIS — R1013 Epigastric pain: Secondary | ICD-10-CM | POA: Insufficient documentation

## 2017-03-21 DIAGNOSIS — H43813 Vitreous degeneration, bilateral: Secondary | ICD-10-CM | POA: Diagnosis not present

## 2017-03-21 DIAGNOSIS — H35373 Puckering of macula, bilateral: Secondary | ICD-10-CM | POA: Diagnosis not present

## 2017-03-21 DIAGNOSIS — H353123 Nonexudative age-related macular degeneration, left eye, advanced atrophic without subfoveal involvement: Secondary | ICD-10-CM | POA: Diagnosis not present

## 2017-03-21 DIAGNOSIS — H353211 Exudative age-related macular degeneration, right eye, with active choroidal neovascularization: Secondary | ICD-10-CM | POA: Diagnosis not present

## 2017-07-18 DIAGNOSIS — H43813 Vitreous degeneration, bilateral: Secondary | ICD-10-CM | POA: Diagnosis not present

## 2017-07-18 DIAGNOSIS — H35373 Puckering of macula, bilateral: Secondary | ICD-10-CM | POA: Diagnosis not present

## 2017-07-18 DIAGNOSIS — H353123 Nonexudative age-related macular degeneration, left eye, advanced atrophic without subfoveal involvement: Secondary | ICD-10-CM | POA: Diagnosis not present

## 2017-07-18 DIAGNOSIS — H353211 Exudative age-related macular degeneration, right eye, with active choroidal neovascularization: Secondary | ICD-10-CM | POA: Diagnosis not present

## 2017-09-21 DIAGNOSIS — I10 Essential (primary) hypertension: Secondary | ICD-10-CM | POA: Diagnosis not present

## 2017-09-21 DIAGNOSIS — K219 Gastro-esophageal reflux disease without esophagitis: Secondary | ICD-10-CM | POA: Diagnosis not present

## 2017-09-21 DIAGNOSIS — R413 Other amnesia: Secondary | ICD-10-CM | POA: Diagnosis not present

## 2017-09-21 DIAGNOSIS — I739 Peripheral vascular disease, unspecified: Secondary | ICD-10-CM | POA: Diagnosis not present

## 2017-12-04 DIAGNOSIS — H353212 Exudative age-related macular degeneration, right eye, with inactive choroidal neovascularization: Secondary | ICD-10-CM | POA: Diagnosis not present

## 2017-12-04 DIAGNOSIS — H43813 Vitreous degeneration, bilateral: Secondary | ICD-10-CM | POA: Diagnosis not present

## 2017-12-04 DIAGNOSIS — H353123 Nonexudative age-related macular degeneration, left eye, advanced atrophic without subfoveal involvement: Secondary | ICD-10-CM | POA: Diagnosis not present

## 2017-12-04 DIAGNOSIS — H35373 Puckering of macula, bilateral: Secondary | ICD-10-CM | POA: Diagnosis not present

## 2017-12-21 DIAGNOSIS — M7522 Bicipital tendinitis, left shoulder: Secondary | ICD-10-CM | POA: Diagnosis not present

## 2017-12-21 DIAGNOSIS — B3789 Other sites of candidiasis: Secondary | ICD-10-CM | POA: Diagnosis not present

## 2018-01-07 ENCOUNTER — Other Ambulatory Visit: Payer: Self-pay

## 2018-01-07 ENCOUNTER — Encounter: Payer: Self-pay | Admitting: Emergency Medicine

## 2018-01-07 ENCOUNTER — Emergency Department
Admission: EM | Admit: 2018-01-07 | Discharge: 2018-01-07 | Disposition: A | Discharge status: Home / Self Care | Payer: PPO | Attending: Emergency Medicine | Admitting: Emergency Medicine

## 2018-01-07 ENCOUNTER — Emergency Department: Payer: PPO

## 2018-01-07 DIAGNOSIS — R0989 Other specified symptoms and signs involving the circulatory and respiratory systems: Secondary | ICD-10-CM | POA: Diagnosis not present

## 2018-01-07 DIAGNOSIS — Z79899 Other long term (current) drug therapy: Secondary | ICD-10-CM | POA: Insufficient documentation

## 2018-01-07 DIAGNOSIS — F0390 Unspecified dementia without behavioral disturbance: Secondary | ICD-10-CM | POA: Diagnosis not present

## 2018-01-07 DIAGNOSIS — I10 Essential (primary) hypertension: Secondary | ICD-10-CM | POA: Insufficient documentation

## 2018-01-07 DIAGNOSIS — R070 Pain in throat: Secondary | ICD-10-CM | POA: Diagnosis not present

## 2018-01-07 DIAGNOSIS — Z96642 Presence of left artificial hip joint: Secondary | ICD-10-CM | POA: Insufficient documentation

## 2018-01-07 DIAGNOSIS — Z7982 Long term (current) use of aspirin: Secondary | ICD-10-CM | POA: Diagnosis not present

## 2018-01-07 DIAGNOSIS — R221 Localized swelling, mass and lump, neck: Secondary | ICD-10-CM | POA: Insufficient documentation

## 2018-01-07 DIAGNOSIS — M6281 Muscle weakness (generalized): Secondary | ICD-10-CM | POA: Diagnosis not present

## 2018-01-07 DIAGNOSIS — R1310 Dysphagia, unspecified: Secondary | ICD-10-CM | POA: Diagnosis not present

## 2018-01-07 LAB — COMPREHENSIVE METABOLIC PANEL
ALBUMIN: 3.5 g/dL (ref 3.5–5.0)
ALK PHOS: 73 U/L (ref 38–126)
ALT: 11 U/L (ref 0–44)
AST: 15 U/L (ref 15–41)
Anion gap: 7 (ref 5–15)
BUN: 19 mg/dL (ref 8–23)
CHLORIDE: 103 mmol/L (ref 98–111)
CO2: 29 mmol/L (ref 22–32)
Calcium: 9.1 mg/dL (ref 8.9–10.3)
Creatinine, Ser: 0.74 mg/dL (ref 0.44–1.00)
GFR calc Af Amer: >60 mL/min (ref >60)
GFR calc non Af Amer: >60 mL/min (ref >60)
Glucose, Bld: 99 mg/dL (ref 70–99)
Potassium: 4.1 mmol/L (ref 3.5–5.1)
Sodium: 139 mmol/L (ref 135–145)
Total Bilirubin: 0.8 mg/dL (ref 0.3–1.2)
Total Protein: 6.3 g/dL — ABNORMAL LOW (ref 6.5–8.1)

## 2018-01-07 LAB — CBC WITH DIFFERENTIAL/PLATELET
BASOS ABS: 0 10*3/uL (ref 0–0.1)
BASOS PCT: 1 %
EOS ABS: 0.3 10*3/uL (ref 0–0.7)
Eosinophils Relative: 4 %
HCT: 37.6 % (ref 35.0–47.0)
Hemoglobin: 12.4 g/dL (ref 12.0–16.0)
Lymphocytes Relative: 28 %
Lymphs Abs: 2 10*3/uL (ref 1.0–3.6)
MCH: 27 pg (ref 26.0–34.0)
MCHC: 33 g/dL (ref 32.0–36.0)
MCV: 81.8 fL (ref 80.0–100.0)
Monocytes Absolute: 0.6 10*3/uL (ref 0.2–0.9)
Monocytes Relative: 9 %
Neutro Abs: 4.3 10*3/uL (ref 1.4–6.5)
Neutrophils Relative %: 58 %
Platelets: 253 10*3/uL (ref 150–440)
RBC: 4.59 MIL/uL (ref 3.80–5.20)
RDW: 14 % (ref 11.5–14.5)
WBC: 7.3 10*3/uL (ref 3.6–11.0)

## 2018-01-07 LAB — TROPONIN I: Troponin I: <0.03 ng/mL (ref <0.03)

## 2018-01-07 NOTE — ED Provider Notes (Signed)
[facility] Emergency Department Provider Note  ____________________________________________   I have reviewed the triage vital signs and the nursing notes.   HISTORY  Chief Complaint Sore Throat   History limited by: Not Limited   HPI [name] is a 84 y.o. female who presents to the emergency department today because of concern for sensation of a lump in her throat. Noticed it this morning. Felt like it made swallowing more difficult. She denies any unusual feelings last night. No unusual ingestion. States that at the time of the examination that feeling had improved.   Per medical record review patient has a history of atrial fibrillation, gerd.  Past Medical History:  Diagnosis Date  . Dementia   . GERD (gastroesophageal reflux disease)   . Hypertension   . Macular degeneration     Patient Active Problem List   Diagnosis Date Noted  . Hip fracture (HCC) 06/12/2016    Past Surgical History:  Procedure Laterality Date  . ABDOMINAL HYSTERECTOMY    . COLONOSCOPY    . TOTAL HIP ARTHROPLASTY    . TOTAL HIP ARTHROPLASTY Left 06/12/2016   Procedure: left hip hemiarthroplasty anterior approach;  Surgeon: Kennedy BuckerMichael Menz, MD;  Location: [facility] ORS;  Service: Orthopedics;  Laterality: Left;    Prior to Admission medications   Medication Sig Start Date End Date Taking? Authorizing Provider  acetaminophen (TYLENOL) 325 MG tablet Take 2 tablets (650 mg total) by mouth 3 (three) times daily. 06/16/16   Alford HighlandWieting, Richard, MD  aspirin EC 81 MG tablet Take 81 mg by mouth daily.    [provider]  Calcium Carbonate Antacid (TUMS PO) Take 1 tablet by mouth daily.    [provider]  docusate sodium (COLACE) 100 MG capsule Take 1 capsule (100 mg total) by mouth 2 (two) times daily. 06/16/16   Alford HighlandWieting, Richard, MD  levofloxacin (LEVAQUIN) 750 MG tablet Take 1 tablet (750 mg total) by mouth every other day. 06/18/16   Alford HighlandWieting, Richard, MD   metoprolol tartrate (LOPRESSOR) 50 MG tablet  03/08/17   [provider]  Multiple Vitamins-Minerals (OCUVITE ADULT 50+) CAPS Take 1 capsule by mouth daily.    [provider]  omeprazole (PRILOSEC) 20 MG capsule Take 20 mg by mouth daily. 06/08/16   [provider]  vitamin B-12 (CYANOCOBALAMIN) 1000 MCG tablet Take 1 tablet by mouth daily.    [provider]    Allergies Atorvastatin  Family History  Problem Relation Age of Onset  . Stroke Mother     Social History Social History   Tobacco Use  . Smoking status: Never Smoker  . Smokeless tobacco: Never Used  Substance Use Topics  . Alcohol use: Never    Frequency: Never  . Drug use: Never    Review of Systems Constitutional: No fever/chills Eyes: No visual changes. ENT: Sensation of a lump in the throat.  Cardiovascular: Denies chest pain. Respiratory: Denies shortness of breath. Gastrointestinal: No abdominal pain.  No nausea, no vomiting.  No diarrhea.   Genitourinary: Negative for dysuria. Musculoskeletal: Negative for back pain. Skin: Negative for rash. Neurological: Negative for headaches, focal weakness or numbness.  ____________________________________________   PHYSICAL EXAM:  VITAL SIGNS: ED Triage Vitals [01/07/18 0628]  Enc Vitals Group     BP 136/88     Pulse Rate 77     Resp 16     Temp 98.2 F (36.8 C)     Temp Source Oral     SpO2 98 %  Weight 152 lb (68.9 kg)     Height 5' (1.524 m)     Head Circumference      Peak Flow    Constitutional: Alert and oriented.  Eyes: Conjunctivae are normal.  ENT      Head: Normocephalic and atraumatic.      Nose: No congestion/rhinnorhea.      Mouth/Throat: Mucous membranes are moist.      Neck: No stridor. Hematological/Lymphatic/Immunilogical: No cervical lymphadenopathy. Cardiovascular: Normal rate, regular rhythm.  Positive for murmur.  Respiratory: Normal respiratory effort without tachypnea nor  retractions. Breath sounds are clear and equal bilaterally. No wheezes/rales/rhonchi. Gastrointestinal: Soft and non tender. No rebound. No guarding.  Genitourinary: Deferred Musculoskeletal: Normal range of motion in all extremities. No lower extremity edema. Neurologic:  Normal speech and language. No gross focal neurologic deficits are appreciated.  Skin:  Skin is warm, dry and intact. No rash noted. Psychiatric: Mood and affect are normal. Speech and behavior are normal. Patient exhibits appropriate insight and judgment.  ____________________________________________    LABS (pertinent positives/negatives)  CMP wnl except tot prot 6.3 Trop <0.03 CBC wnl  ____________________________________________   EKG  I, Phineas Semen, attending physician, personally viewed and interpreted this EKG  EKG Time: 0625 Rate: 64 Rhythm: atrial fibrillation Axis: normal Intervals: qtc 441 QRS: LBBB ST changes: no st elevation Impression: abnormal ekg  ____________________________________________    RADIOLOGY  CXR No acute findings  ____________________________________________   PROCEDURES  Procedures  ____________________________________________   INITIAL IMPRESSION / ASSESSMENT AND PLAN / ED COURSE  Pertinent labs & imaging results that were available during my care of the patient were reviewed by me and considered in my medical decision making (see chart for details).   Patient presented to the emergency department today because of concerns for sensation of a lump in her throat.  This sensation had improved by the time my examination.  Patient was able to drink ginger ale here without problem.  She stated it was feeling better.  At this point think is reasonable for patient to follow-up with PCP and ENT.  No blood work findings would be suggestive of a deep tissue infection.  Do not see any obvious erythema in the  oropharynx.  ____________________________________________   FINAL CLINICAL IMPRESSION(S) / ED DIAGNOSES  Final diagnoses:  Sensation of lump in throat     Note: This dictation was prepared with Dragon dictation. Any transcriptional errors that result from this process are unintentional     Phineas Semen, MD 01/07/18 [phone]

## 2018-01-07 NOTE — ED Notes (Signed)
Pt ambulatory with assistance to bathroom. Pt has requested to sit in chair and not in the bed. Pt is A/O x4.Pt was given gingerale per EDP verbal order and tolerated it well.   Pt is depending D/C at this time.

## 2018-01-07 NOTE — Discharge Instructions (Addendum)
Please seek medical attention for any high fevers, chest pain, shortness of breath, change in behavior, persistent vomiting, bloody stool or any other new or concerning symptoms.  

## 2018-01-07 NOTE — ED Triage Notes (Signed)
[facility] Emergency Department [facility]  with complaints of "lump in my throat and it hurts to swallow"  No other complaints  Pt 8/10 pain throat that started at 0400  Pt AFIB with PVCs for EMS

## 2018-01-07 NOTE — ED Notes (Signed)
Pt has signed and agreeable to D/C. Pt is currently awaiting her daughter for D/C

## 2018-01-07 NOTE — ED Notes (Signed)
Pt denies dysuria or urinary frequency, pt throat, smile, tongue appears symmetrical however tongue has dark purple spots appearing at edges and more populous towards rear of tongue - denies trauma, or hx of same, pt reports lump in throat not impairing breathing or swallowing

## 2018-01-14 DIAGNOSIS — I872 Venous insufficiency (chronic) (peripheral): Secondary | ICD-10-CM | POA: Diagnosis not present

## 2018-01-14 DIAGNOSIS — I10 Essential (primary) hypertension: Secondary | ICD-10-CM | POA: Diagnosis not present

## 2018-01-14 DIAGNOSIS — R600 Localized edema: Secondary | ICD-10-CM | POA: Diagnosis not present

## 2018-02-25 DIAGNOSIS — R413 Other amnesia: Secondary | ICD-10-CM | POA: Diagnosis not present

## 2018-02-25 DIAGNOSIS — I10 Essential (primary) hypertension: Secondary | ICD-10-CM | POA: Diagnosis not present

## 2018-02-25 DIAGNOSIS — K219 Gastro-esophageal reflux disease without esophagitis: Secondary | ICD-10-CM | POA: Diagnosis not present

## 2018-02-25 DIAGNOSIS — I4891 Unspecified atrial fibrillation: Secondary | ICD-10-CM | POA: Diagnosis not present

## 2018-03-05 DIAGNOSIS — H353123 Nonexudative age-related macular degeneration, left eye, advanced atrophic without subfoveal involvement: Secondary | ICD-10-CM | POA: Diagnosis not present

## 2018-03-05 DIAGNOSIS — H353212 Exudative age-related macular degeneration, right eye, with inactive choroidal neovascularization: Secondary | ICD-10-CM | POA: Diagnosis not present

## 2018-03-11 DIAGNOSIS — F0280 Dementia in other diseases classified elsewhere without behavioral disturbance: Secondary | ICD-10-CM | POA: Diagnosis not present

## 2018-03-14 DIAGNOSIS — E559 Vitamin D deficiency, unspecified: Secondary | ICD-10-CM | POA: Diagnosis not present

## 2018-03-14 DIAGNOSIS — E7849 Other hyperlipidemia: Secondary | ICD-10-CM | POA: Diagnosis not present

## 2018-03-14 DIAGNOSIS — Z79899 Other long term (current) drug therapy: Secondary | ICD-10-CM | POA: Diagnosis not present

## 2018-03-14 DIAGNOSIS — E038 Other specified hypothyroidism: Secondary | ICD-10-CM | POA: Diagnosis not present

## 2018-03-14 DIAGNOSIS — E119 Type 2 diabetes mellitus without complications: Secondary | ICD-10-CM | POA: Diagnosis not present

## 2018-03-14 DIAGNOSIS — D518 Other vitamin B12 deficiency anemias: Secondary | ICD-10-CM | POA: Diagnosis not present

## 2018-04-02 DIAGNOSIS — M1711 Unilateral primary osteoarthritis, right knee: Secondary | ICD-10-CM | POA: Diagnosis not present

## 2018-04-02 DIAGNOSIS — I739 Peripheral vascular disease, unspecified: Secondary | ICD-10-CM | POA: Diagnosis not present

## 2018-04-02 DIAGNOSIS — M25561 Pain in right knee: Secondary | ICD-10-CM | POA: Diagnosis not present

## 2018-04-02 DIAGNOSIS — I10 Essential (primary) hypertension: Secondary | ICD-10-CM | POA: Diagnosis not present

## 2018-04-02 DIAGNOSIS — M1712 Unilateral primary osteoarthritis, left knee: Secondary | ICD-10-CM | POA: Diagnosis not present

## 2018-04-04 DIAGNOSIS — I2510 Atherosclerotic heart disease of native coronary artery without angina pectoris: Secondary | ICD-10-CM | POA: Diagnosis not present

## 2018-04-04 DIAGNOSIS — F331 Major depressive disorder, recurrent, moderate: Secondary | ICD-10-CM | POA: Diagnosis not present

## 2018-04-11 DIAGNOSIS — E7849 Other hyperlipidemia: Secondary | ICD-10-CM | POA: Diagnosis not present

## 2018-04-11 DIAGNOSIS — Z79899 Other long term (current) drug therapy: Secondary | ICD-10-CM | POA: Diagnosis not present

## 2018-04-11 DIAGNOSIS — D518 Other vitamin B12 deficiency anemias: Secondary | ICD-10-CM | POA: Diagnosis not present

## 2018-04-11 DIAGNOSIS — E119 Type 2 diabetes mellitus without complications: Secondary | ICD-10-CM | POA: Diagnosis not present

## 2018-04-30 DIAGNOSIS — I10 Essential (primary) hypertension: Secondary | ICD-10-CM | POA: Diagnosis not present

## 2018-04-30 DIAGNOSIS — K219 Gastro-esophageal reflux disease without esophagitis: Secondary | ICD-10-CM | POA: Diagnosis not present

## 2018-04-30 DIAGNOSIS — J069 Acute upper respiratory infection, unspecified: Secondary | ICD-10-CM | POA: Diagnosis not present

## 2018-04-30 DIAGNOSIS — I739 Peripheral vascular disease, unspecified: Secondary | ICD-10-CM | POA: Diagnosis not present

## 2018-05-01 DIAGNOSIS — F330 Major depressive disorder, recurrent, mild: Secondary | ICD-10-CM | POA: Diagnosis not present

## 2018-05-09 DIAGNOSIS — F4320 Adjustment disorder, unspecified: Secondary | ICD-10-CM | POA: Diagnosis not present

## 2018-05-22 DIAGNOSIS — H353123 Nonexudative age-related macular degeneration, left eye, advanced atrophic without subfoveal involvement: Secondary | ICD-10-CM | POA: Diagnosis not present

## 2018-05-22 DIAGNOSIS — H35372 Puckering of macula, left eye: Secondary | ICD-10-CM | POA: Diagnosis not present

## 2018-05-22 DIAGNOSIS — H353212 Exudative age-related macular degeneration, right eye, with inactive choroidal neovascularization: Secondary | ICD-10-CM | POA: Diagnosis not present

## 2018-05-22 DIAGNOSIS — H43813 Vitreous degeneration, bilateral: Secondary | ICD-10-CM | POA: Diagnosis not present

## 2018-05-28 DIAGNOSIS — M1711 Unilateral primary osteoarthritis, right knee: Secondary | ICD-10-CM | POA: Diagnosis not present

## 2018-05-28 DIAGNOSIS — K219 Gastro-esophageal reflux disease without esophagitis: Secondary | ICD-10-CM | POA: Diagnosis not present

## 2018-05-28 DIAGNOSIS — F4320 Adjustment disorder, unspecified: Secondary | ICD-10-CM | POA: Diagnosis not present

## 2018-05-28 DIAGNOSIS — M1712 Unilateral primary osteoarthritis, left knee: Secondary | ICD-10-CM | POA: Diagnosis not present

## 2018-05-28 DIAGNOSIS — F330 Major depressive disorder, recurrent, mild: Secondary | ICD-10-CM | POA: Diagnosis not present

## 2018-05-28 DIAGNOSIS — I10 Essential (primary) hypertension: Secondary | ICD-10-CM | POA: Diagnosis not present

## 2018-06-07 DIAGNOSIS — E7849 Other hyperlipidemia: Secondary | ICD-10-CM | POA: Diagnosis not present

## 2018-06-07 DIAGNOSIS — Z79899 Other long term (current) drug therapy: Secondary | ICD-10-CM | POA: Diagnosis not present

## 2018-06-07 DIAGNOSIS — E119 Type 2 diabetes mellitus without complications: Secondary | ICD-10-CM | POA: Diagnosis not present

## 2018-06-07 DIAGNOSIS — D518 Other vitamin B12 deficiency anemias: Secondary | ICD-10-CM | POA: Diagnosis not present

## 2018-06-07 DIAGNOSIS — E038 Other specified hypothyroidism: Secondary | ICD-10-CM | POA: Diagnosis not present

## 2018-06-07 DIAGNOSIS — E559 Vitamin D deficiency, unspecified: Secondary | ICD-10-CM | POA: Diagnosis not present

## 2018-07-02 DIAGNOSIS — I739 Peripheral vascular disease, unspecified: Secondary | ICD-10-CM | POA: Diagnosis not present

## 2018-07-02 DIAGNOSIS — H3530 Unspecified macular degeneration: Secondary | ICD-10-CM | POA: Diagnosis not present

## 2018-07-02 DIAGNOSIS — M159 Polyosteoarthritis, unspecified: Secondary | ICD-10-CM | POA: Diagnosis not present

## 2018-07-02 DIAGNOSIS — I10 Essential (primary) hypertension: Secondary | ICD-10-CM | POA: Diagnosis not present

## 2018-07-18 DIAGNOSIS — F0280 Dementia in other diseases classified elsewhere without behavioral disturbance: Secondary | ICD-10-CM | POA: Diagnosis not present

## 2018-07-23 DIAGNOSIS — F4320 Adjustment disorder, unspecified: Secondary | ICD-10-CM | POA: Diagnosis not present

## 2018-07-23 DIAGNOSIS — F330 Major depressive disorder, recurrent, mild: Secondary | ICD-10-CM | POA: Diagnosis not present

## 2018-07-30 DIAGNOSIS — K219 Gastro-esophageal reflux disease without esophagitis: Secondary | ICD-10-CM | POA: Diagnosis not present

## 2018-07-30 DIAGNOSIS — I10 Essential (primary) hypertension: Secondary | ICD-10-CM | POA: Diagnosis not present

## 2018-07-30 DIAGNOSIS — S161XXA Strain of muscle, fascia and tendon at neck level, initial encounter: Secondary | ICD-10-CM | POA: Diagnosis not present

## 2018-07-30 DIAGNOSIS — I739 Peripheral vascular disease, unspecified: Secondary | ICD-10-CM | POA: Diagnosis not present

## 2018-08-01 DIAGNOSIS — F0280 Dementia in other diseases classified elsewhere without behavioral disturbance: Secondary | ICD-10-CM | POA: Diagnosis not present

## 2018-08-02 DIAGNOSIS — M2042 Other hammer toe(s) (acquired), left foot: Secondary | ICD-10-CM | POA: Diagnosis not present

## 2018-08-02 DIAGNOSIS — I739 Peripheral vascular disease, unspecified: Secondary | ICD-10-CM | POA: Diagnosis not present

## 2018-08-02 DIAGNOSIS — B351 Tinea unguium: Secondary | ICD-10-CM | POA: Diagnosis not present

## 2018-08-21 DIAGNOSIS — H353212 Exudative age-related macular degeneration, right eye, with inactive choroidal neovascularization: Secondary | ICD-10-CM | POA: Diagnosis not present

## 2018-08-29 DIAGNOSIS — F0280 Dementia in other diseases classified elsewhere without behavioral disturbance: Secondary | ICD-10-CM | POA: Diagnosis not present

## 2018-10-15 DIAGNOSIS — K219 Gastro-esophageal reflux disease without esophagitis: Secondary | ICD-10-CM | POA: Diagnosis not present

## 2018-10-15 DIAGNOSIS — I10 Essential (primary) hypertension: Secondary | ICD-10-CM | POA: Diagnosis not present

## 2018-10-15 DIAGNOSIS — E785 Hyperlipidemia, unspecified: Secondary | ICD-10-CM | POA: Diagnosis not present

## 2018-10-15 DIAGNOSIS — F330 Major depressive disorder, recurrent, mild: Secondary | ICD-10-CM | POA: Diagnosis not present

## 2018-10-23 DIAGNOSIS — N390 Urinary tract infection, site not specified: Secondary | ICD-10-CM | POA: Diagnosis not present

## 2018-10-24 DIAGNOSIS — R52 Pain, unspecified: Secondary | ICD-10-CM | POA: Diagnosis not present

## 2018-10-24 DIAGNOSIS — M25562 Pain in left knee: Secondary | ICD-10-CM | POA: Diagnosis not present

## 2018-10-29 DIAGNOSIS — F419 Anxiety disorder, unspecified: Secondary | ICD-10-CM | POA: Diagnosis not present

## 2018-10-29 DIAGNOSIS — R451 Restlessness and agitation: Secondary | ICD-10-CM | POA: Diagnosis not present

## 2018-10-29 DIAGNOSIS — G4700 Insomnia, unspecified: Secondary | ICD-10-CM | POA: Diagnosis not present

## 2018-11-01 DIAGNOSIS — M25559 Pain in unspecified hip: Secondary | ICD-10-CM | POA: Diagnosis not present

## 2018-11-07 DIAGNOSIS — F0280 Dementia in other diseases classified elsewhere without behavioral disturbance: Secondary | ICD-10-CM | POA: Diagnosis not present

## 2018-11-07 DIAGNOSIS — F419 Anxiety disorder, unspecified: Secondary | ICD-10-CM | POA: Diagnosis not present

## 2018-11-19 DIAGNOSIS — M1712 Unilateral primary osteoarthritis, left knee: Secondary | ICD-10-CM | POA: Diagnosis not present

## 2018-11-19 DIAGNOSIS — K219 Gastro-esophageal reflux disease without esophagitis: Secondary | ICD-10-CM | POA: Diagnosis not present

## 2018-11-19 DIAGNOSIS — I10 Essential (primary) hypertension: Secondary | ICD-10-CM | POA: Diagnosis not present

## 2018-11-19 DIAGNOSIS — M1711 Unilateral primary osteoarthritis, right knee: Secondary | ICD-10-CM | POA: Diagnosis not present

## 2018-11-25 DIAGNOSIS — F419 Anxiety disorder, unspecified: Secondary | ICD-10-CM | POA: Diagnosis not present

## 2018-11-25 DIAGNOSIS — F330 Major depressive disorder, recurrent, mild: Secondary | ICD-10-CM | POA: Diagnosis not present

## 2018-11-26 DIAGNOSIS — G309 Alzheimer's disease, unspecified: Secondary | ICD-10-CM | POA: Diagnosis not present

## 2018-11-26 DIAGNOSIS — M170 Bilateral primary osteoarthritis of knee: Secondary | ICD-10-CM | POA: Diagnosis not present

## 2018-11-26 DIAGNOSIS — E785 Hyperlipidemia, unspecified: Secondary | ICD-10-CM | POA: Diagnosis not present

## 2018-11-26 DIAGNOSIS — G4700 Insomnia, unspecified: Secondary | ICD-10-CM | POA: Diagnosis not present

## 2018-12-02 DIAGNOSIS — F419 Anxiety disorder, unspecified: Secondary | ICD-10-CM | POA: Diagnosis not present

## 2018-12-02 DIAGNOSIS — F330 Major depressive disorder, recurrent, mild: Secondary | ICD-10-CM | POA: Diagnosis not present

## 2018-12-13 DIAGNOSIS — S80821A Blister (nonthermal), right lower leg, initial encounter: Secondary | ICD-10-CM | POA: Diagnosis not present

## 2018-12-13 DIAGNOSIS — R600 Localized edema: Secondary | ICD-10-CM | POA: Diagnosis not present

## 2018-12-13 DIAGNOSIS — S80822A Blister (nonthermal), left lower leg, initial encounter: Secondary | ICD-10-CM | POA: Diagnosis not present

## 2018-12-17 ENCOUNTER — Inpatient Hospital Stay
Admission: EM | Admit: 2018-12-17 | Discharge: 2018-12-19 | DRG: 683 | Disposition: A | Discharge status: Home Health | Payer: PPO | Source: Skilled Nursing Facility | Attending: Internal Medicine | Admitting: Internal Medicine

## 2018-12-17 ENCOUNTER — Other Ambulatory Visit: Payer: Self-pay

## 2018-12-17 ENCOUNTER — Emergency Department: Payer: PPO

## 2018-12-17 ENCOUNTER — Encounter: Payer: Self-pay | Admitting: Emergency Medicine

## 2018-12-17 DIAGNOSIS — K219 Gastro-esophageal reflux disease without esophagitis: Secondary | ICD-10-CM | POA: Diagnosis present

## 2018-12-17 DIAGNOSIS — Z66 Do not resuscitate: Secondary | ICD-10-CM | POA: Diagnosis present

## 2018-12-17 DIAGNOSIS — I361 Nonrheumatic tricuspid (valve) insufficiency: Secondary | ICD-10-CM | POA: Diagnosis not present

## 2018-12-17 DIAGNOSIS — Z20828 Contact with and (suspected) exposure to other viral communicable diseases: Secondary | ICD-10-CM | POA: Diagnosis not present

## 2018-12-17 DIAGNOSIS — R609 Edema, unspecified: Secondary | ICD-10-CM | POA: Diagnosis present

## 2018-12-17 DIAGNOSIS — Z7982 Long term (current) use of aspirin: Secondary | ICD-10-CM | POA: Diagnosis not present

## 2018-12-17 DIAGNOSIS — N179 Acute kidney failure, unspecified: Secondary | ICD-10-CM | POA: Diagnosis not present

## 2018-12-17 DIAGNOSIS — D518 Other vitamin B12 deficiency anemias: Secondary | ICD-10-CM | POA: Diagnosis not present

## 2018-12-17 DIAGNOSIS — I110 Hypertensive heart disease with heart failure: Secondary | ICD-10-CM | POA: Diagnosis not present

## 2018-12-17 DIAGNOSIS — I5032 Chronic diastolic (congestive) heart failure: Secondary | ICD-10-CM | POA: Diagnosis present

## 2018-12-17 DIAGNOSIS — G8929 Other chronic pain: Secondary | ICD-10-CM | POA: Diagnosis not present

## 2018-12-17 DIAGNOSIS — R600 Localized edema: Secondary | ICD-10-CM | POA: Diagnosis not present

## 2018-12-17 DIAGNOSIS — E8809 Other disorders of plasma-protein metabolism, not elsewhere classified: Secondary | ICD-10-CM | POA: Diagnosis not present

## 2018-12-17 DIAGNOSIS — Z96642 Presence of left artificial hip joint: Secondary | ICD-10-CM | POA: Diagnosis not present

## 2018-12-17 DIAGNOSIS — E119 Type 2 diabetes mellitus without complications: Secondary | ICD-10-CM | POA: Diagnosis not present

## 2018-12-17 DIAGNOSIS — Z823 Family history of stroke: Secondary | ICD-10-CM | POA: Diagnosis not present

## 2018-12-17 DIAGNOSIS — Z79899 Other long term (current) drug therapy: Secondary | ICD-10-CM | POA: Diagnosis not present

## 2018-12-17 DIAGNOSIS — I10 Essential (primary) hypertension: Secondary | ICD-10-CM | POA: Diagnosis not present

## 2018-12-17 DIAGNOSIS — Z03818 Encounter for observation for suspected exposure to other biological agents ruled out: Secondary | ICD-10-CM | POA: Diagnosis not present

## 2018-12-17 DIAGNOSIS — M7989 Other specified soft tissue disorders: Secondary | ICD-10-CM | POA: Diagnosis not present

## 2018-12-17 DIAGNOSIS — I340 Nonrheumatic mitral (valve) insufficiency: Secondary | ICD-10-CM | POA: Diagnosis not present

## 2018-12-17 DIAGNOSIS — F0390 Unspecified dementia without behavioral disturbance: Secondary | ICD-10-CM | POA: Diagnosis present

## 2018-12-17 DIAGNOSIS — M170 Bilateral primary osteoarthritis of knee: Secondary | ICD-10-CM | POA: Diagnosis not present

## 2018-12-17 DIAGNOSIS — E7849 Other hyperlipidemia: Secondary | ICD-10-CM | POA: Diagnosis not present

## 2018-12-17 LAB — CBC
HCT: 32.1 % — ABNORMAL LOW (ref 36.0–46.0)
Hemoglobin: 10 g/dL — ABNORMAL LOW (ref 12.0–15.0)
MCH: 27.7 pg (ref 26.0–34.0)
MCHC: 31.2 g/dL (ref 30.0–36.0)
MCV: 88.9 fL (ref 80.0–100.0)
Platelets: 236 10*3/uL (ref 150–400)
RBC: 3.61 MIL/uL — ABNORMAL LOW (ref 3.87–5.11)
RDW: 17.2 % — ABNORMAL HIGH (ref 11.5–15.5)
WBC: 7.8 10*3/uL (ref 4.0–10.5)
nRBC: 0 % (ref 0.0–0.2)

## 2018-12-17 LAB — URINALYSIS, COMPLETE (UACMP) WITH MICROSCOPIC
Bilirubin Urine: NEGATIVE
Glucose, UA: NEGATIVE mg/dL
Hgb urine dipstick: NEGATIVE
Ketones, ur: NEGATIVE mg/dL
Nitrite: NEGATIVE
Protein, ur: NEGATIVE mg/dL
Specific Gravity, Urine: 1.013 (ref 1.005–1.030)
pH: 5 (ref 5.0–8.0)

## 2018-12-17 LAB — BASIC METABOLIC PANEL
Anion gap: 11 (ref 5–15)
BUN: 54 mg/dL — ABNORMAL HIGH (ref 8–23)
CO2: 20 mmol/L — ABNORMAL LOW (ref 22–32)
Calcium: 8.7 mg/dL — ABNORMAL LOW (ref 8.9–10.3)
Chloride: 101 mmol/L (ref 98–111)
Creatinine, Ser: 2.15 mg/dL — ABNORMAL HIGH (ref 0.44–1.00)
GFR calc Af Amer: 22 mL/min — ABNORMAL LOW (ref >60)
GFR calc non Af Amer: 19 mL/min — ABNORMAL LOW (ref >60)
Glucose, Bld: 88 mg/dL (ref 70–99)
Potassium: 4.2 mmol/L (ref 3.5–5.1)
Sodium: 132 mmol/L — ABNORMAL LOW (ref 135–145)

## 2018-12-17 LAB — BRAIN NATRIURETIC PEPTIDE: B Natriuretic Peptide: 479 pg/mL — ABNORMAL HIGH (ref 0.0–100.0)

## 2018-12-17 MED ORDER — TRAZODONE HCL 50 MG PO TABS
50.0000 mg | ORAL_TABLET | Freq: Every day | ORAL | Status: DC
  Administered 2018-12-17: 50 mg via ORAL
  Filled 2018-12-17: qty 1

## 2018-12-17 NOTE — H&P (Signed)
[facility] [place] [facility]   PATIENT NAME: [name]    MR#:  [id number]  DATE OF BIRTH:  May 17, 1924  DATE OF ADMISSION:  12/17/2018  PRIMARY CARE PHYSICIAN: Dorothey BasemanBronstein, Shivon Hackel, MD   REQUESTING/REFERRING PHYSICIAN: Trisha Mangleriplett, NP  CHIEF COMPLAINT:   Chief Complaint  Patient presents with  . Blister  . Leg Swelling    HISTORY OF PRESENT ILLNESS:  Maria Ewingsolly Fortunato  is a 85 y.o. female who presents with chief complaint as above.  Patient brought to the ED with a complaint of lower extremity edema.  Here she is found to have significant AKI.  Per report she has had very poor p.o. intake recently, likely due to depression from restriction on family visitations at her facility.  She does have significant pitting edema on her lower extremities, including with some blistering and skin tear.  Hospitalist were called for admission and further evaluation  PAST MEDICAL HISTORY:   Past Medical History:  Diagnosis Date  . Dementia (HCC)   . GERD (gastroesophageal reflux disease)   . Hypertension   . Macular degeneration      PAST SURGICAL HISTORY:   Past Surgical History:  Procedure Laterality Date  . ABDOMINAL HYSTERECTOMY    . COLONOSCOPY    . TOTAL HIP ARTHROPLASTY    . TOTAL HIP ARTHROPLASTY Left 06/12/2016   Procedure: left hip hemiarthroplasty anterior approach;  Surgeon: Kennedy BuckerMichael Menz, MD;  Location: [facility] ORS;  Service: Orthopedics;  Laterality: Left;     SOCIAL HISTORY:   Social History   Tobacco Use  . Smoking status: Never Smoker  . Smokeless tobacco: Never Used  Substance Use Topics  . Alcohol use: Never    Frequency: Never     FAMILY HISTORY:   Family History  Problem Relation Age of Onset  . Stroke Mother      DRUG ALLERGIES:   Allergies  Allergen Reactions  . Atorvastatin Other (See Comments)    Muscle pain     MEDICATIONS AT HOME:   Prior to Admission medications   Medication Sig Start Date End Date Taking?  Authorizing Provider  acetaminophen (TYLENOL) 325 MG tablet Take 2 tablets (650 mg total) by mouth 3 (three) times daily. 06/16/16   Alford HighlandWieting, Richard, MD  aspirin EC 81 MG tablet Take 81 mg by mouth daily.    [provider]  Calcium Carbonate Antacid (TUMS PO) Take 1 tablet by mouth daily.    [provider]  docusate sodium (COLACE) 100 MG capsule Take 1 capsule (100 mg total) by mouth 2 (two) times daily. 06/16/16   Alford HighlandWieting, Richard, MD  levofloxacin (LEVAQUIN) 750 MG tablet Take 1 tablet (750 mg total) by mouth every other day. 06/18/16   Alford HighlandWieting, Richard, MD  metoprolol tartrate (LOPRESSOR) 50 MG tablet  03/08/17   [provider]  Multiple Vitamins-Minerals (OCUVITE ADULT 50+) CAPS Take 1 capsule by mouth daily.    [provider]  omeprazole (PRILOSEC) 20 MG capsule Take 20 mg by mouth daily. 06/08/16   [provider]  vitamin B-12 (CYANOCOBALAMIN) 1000 MCG tablet Take 1 tablet by mouth daily.    [provider]    REVIEW OF SYSTEMS:  Review of Systems  Constitutional: Negative for chills, fever, malaise/fatigue and weight loss.  HENT: Negative for ear pain, hearing loss and tinnitus.   Eyes: Negative for blurred vision, double vision, pain and redness.  Respiratory: Negative for cough, hemoptysis and shortness of breath.   Cardiovascular: Positive for  leg swelling. Negative for chest pain, palpitations and orthopnea.  Gastrointestinal: Negative for abdominal pain, constipation, diarrhea, nausea and vomiting.  Genitourinary: Negative for dysuria, frequency and hematuria.  Musculoskeletal: Negative for back pain, joint pain and neck pain.  Skin:       No acne, rash, or lesions  Neurological: Negative for dizziness, tremors, focal weakness and weakness.  Endo/Heme/Allergies: Negative for polydipsia. Does not bruise/bleed easily.  Psychiatric/Behavioral: Positive for depression. The patient is not nervous/anxious and does not have  insomnia.      VITAL SIGNS:   Vitals:   12/17/18 1639 12/17/18 1641 12/17/18 2005  BP: 122/62  126/74  Pulse: 99  (!) 56  Resp: 16  18  Temp: 97.7 F (36.5 C)    TempSrc: Oral    SpO2: 99%  99%  Weight:  72.6 kg   Height:  4\' 11"  (1.499 m)    Wt Readings from Last 3 Encounters:  12/17/18 72.6 kg  01/07/18 68.9 kg  03/14/17 67.6 kg    PHYSICAL EXAMINATION:  Physical Exam  Vitals reviewed. Constitutional: She is oriented to person, place, and time. She appears well-developed and well-nourished. No distress.  HENT:  Head: Normocephalic and atraumatic.  Mouth/Throat: Oropharynx is clear and moist.  Eyes: Pupils are equal, round, and reactive to light. Conjunctivae and EOM are normal. No scleral icterus.  Neck: Normal range of motion. Neck supple. No JVD present. No thyromegaly present.  Cardiovascular: Normal rate, regular rhythm and intact distal pulses. Exam reveals no gallop and no friction rub.  No murmur heard. Respiratory: Effort normal and breath sounds normal. No respiratory distress. She has no wheezes. She has no rales.  GI: Soft. Bowel sounds are normal. She exhibits no distension. There is no abdominal tenderness.  Musculoskeletal: Normal range of motion.        General: Edema (BLLE) present.     Comments: No arthritis, no gout  Lymphadenopathy:    She has no cervical adenopathy.  Neurological: She is alert and oriented to person, place, and time. No cranial nerve deficit.  No dysarthria, no aphasia  Skin: Skin is warm and dry. No rash noted. No erythema.  Psychiatric: She has a normal mood and affect. Her behavior is normal. Judgment and thought content normal.    LABORATORY PANEL:   CBC Recent Labs  Lab 12/17/18 1711  WBC 7.8  HGB 10.0*  HCT 32.1*  PLT 236   ------------------------------------------------------------------------------------------------------------------  Chemistries  Recent Labs  Lab 12/17/18 1711  NA 132*  K 4.2  CL 101   CO2 20*  GLUCOSE 88  BUN 54*  CREATININE 2.15*  CALCIUM 8.7*   ------------------------------------------------------------------------------------------------------------------  Cardiac Enzymes No results for input(s): TROPONINI in the last 168 hours. ------------------------------------------------------------------------------------------------------------------  RADIOLOGY:  Dg Chest 2 View  Result Date: 12/17/2018 CLINICAL DATA:  Peripheral edema EXAM: CHEST - 2 VIEW COMPARISON:  01/07/2018 FINDINGS: Cardiomegaly. No overt edema. No effusions. Linear scarring or atelectasis in the lung bases. No acute bony abnormality. IMPRESSION: Cardiomegaly.  Bibasilar atelectasis or scarring. Electronically Signed   By: Charlett NoseKevin  Dover M.D.   On: 12/17/2018 19:11    EKG:   Orders placed or performed during the hospital encounter of 01/07/18  . EKG 12-Lead  . EKG 12-Lead    IMPRESSION AND PLAN:  Principal Problem:   AKI (acute kidney injury) (HCC) -gentle IV fluids tonight to evaluate for any possible improvement in renal function.  Avoid nephrotoxins.  If her kidney function does not start improving may need  nephrology consult Active Problems:   Peripheral edema -no prior diagnosis of CHF.  We will get an echocardiogram   HTN (hypertension) -home dose antihypertensives   GERD (gastroesophageal reflux disease) -home dose PPI   Dementia ([place]) -with some depression as well, continue trazodone which family states has been helping her  Chart review performed and case discussed with ED provider. Labs, imaging and/or ECG reviewed by provider and discussed with patient/family. Management plans discussed with the patient and/or family.  COVID-19 status: Test Pending  DVT PROPHYLAXIS: SubQ heparin  GI PROPHYLAXIS:  PPI   ADMISSION STATUS: Inpatient     CODE STATUS: DNR Code Status History    Date Active Date Inactive Code Status Order ID Comments User Context   06/12/2016 1254 06/16/2016  1901 DNR [id number]  Vaughan Basta, MD Inpatient   06/12/2016 1112 06/12/2016 1254 Full Code [id number]  Hessie Knows, MD ED   Advance Care Planning Activity    Questions for Most Recent Historical Code Status (Order [id number])    Question Answer Comment   In the event of cardiac or respiratory ARREST Do not call a "code blue"    In the event of cardiac or respiratory ARREST Do not perform Intubation, CPR, defibrillation or ACLS    In the event of cardiac or respiratory ARREST Use medication by any route, position, wound care, and other measures to relive pain and suffering. May use oxygen, suction and manual treatment of airway obstruction as needed for comfort.         Advance Directive Documentation     Most Recent Value  Type of Advance Directive  Healthcare Power of Attorney  Pre-existing out of facility DNR order (yellow form or pink MOST form)  -  "MOST" Form in Place?  -      TOTAL TIME TAKING CARE OF THIS PATIENT: 45 minutes.   This patient was evaluated in the context of the global COVID-19 pandemic, which necessitated consideration that the patient might be at risk for infection with the SARS-CoV-2 virus that causes COVID-19. Institutional protocols and algorithms that pertain to the evaluation of patients at risk for COVID-19 are in a state of rapid change based on information released by regulatory bodies including the CDC and federal and state organizations. These policies and algorithms were followed to the best of this provider's knowledge to date during the patient's care at this facility.  Ethlyn Daniels 12/17/2018, 9:53 PM  [facility]  Office  [phone]  CC: Primary care physician; Juluis Pitch, MD  Note:  This document was prepared using Dragon voice recognition software and may include unintentional dictation errors.

## 2018-12-17 NOTE — ED Triage Notes (Signed)
Patient sent by PCP after blood showed increased BUN and creatinine. Patient was seen due to swelling in bilateral legs for the past several days as well as blisters forming on the bottom of her legs. Patient was placed on HCTZ at the end of last year for swelling and today was given a dose of lasix as well. Patient complaining of pain in both legs.

## 2018-12-17 NOTE — ED Provider Notes (Signed)
[facility] Emergency Department Provider Note  ____________________________________________   None    (approximate)  I have reviewed the triage vital signs and the nursing notes.   HISTORY  Chief Complaint Blister and Leg Swelling   HPI [name] is a 85 y.o. female who presents to the emergency department for treatment and evaluation of lower extremity swelling with large weeping blisters. Labs drawn by PCP show high BUN and Creatinine. Daughter states that she has declined since COVID-19 restrictions have prevented family from visiting. Patient denies feeling short of breath or chest pain.       Past Medical History:  Diagnosis Date  . Dementia (HCC)   . GERD (gastroesophageal reflux disease)   . Hypertension   . Macular degeneration     Patient Active Problem List   Diagnosis Date Noted  . Hip fracture (HCC) 06/12/2016    Past Surgical History:  Procedure Laterality Date  . ABDOMINAL HYSTERECTOMY    . COLONOSCOPY    . TOTAL HIP ARTHROPLASTY    . TOTAL HIP ARTHROPLASTY Left 06/12/2016   Procedure: left hip hemiarthroplasty anterior approach;  Surgeon: Kennedy BuckerMichael Menz, MD;  Location: [facility] ORS;  Service: Orthopedics;  Laterality: Left;    Prior to Admission medications   Medication Sig Start Date End Date Taking? Authorizing Provider  acetaminophen (TYLENOL) 325 MG tablet Take 2 tablets (650 mg total) by mouth 3 (three) times daily. 06/16/16   Alford HighlandWieting, Richard, MD  aspirin EC 81 MG tablet Take 81 mg by mouth daily.    [provider]  Calcium Carbonate Antacid (TUMS PO) Take 1 tablet by mouth daily.    [provider]  docusate sodium (COLACE) 100 MG capsule Take 1 capsule (100 mg total) by mouth 2 (two) times daily. 06/16/16   Alford HighlandWieting, Richard, MD  levofloxacin (LEVAQUIN) 750 MG tablet Take 1 tablet (750 mg total) by mouth every other day. 06/18/16   Alford HighlandWieting, Richard, MD  metoprolol tartrate (LOPRESSOR) 50 MG tablet   03/08/17   [provider]  Multiple Vitamins-Minerals (OCUVITE ADULT 50+) CAPS Take 1 capsule by mouth daily.    [provider]  omeprazole (PRILOSEC) 20 MG capsule Take 20 mg by mouth daily. 06/08/16   [provider]  vitamin B-12 (CYANOCOBALAMIN) 1000 MCG tablet Take 1 tablet by mouth daily.    [provider]    Allergies Atorvastatin  Family History  Problem Relation Age of Onset  . Stroke Mother     Social History Social History   Tobacco Use  . Smoking status: Never Smoker  . Smokeless tobacco: Never Used  Substance Use Topics  . Alcohol use: Never    Frequency: Never  . Drug use: Never    Review of Systems  Constitutional: No fever/chills Eyes: No visual changes. ENT: No sore throat. Cardiovascular: Denies chest pain. Respiratory: Denies shortness of breath. Gastrointestinal: No abdominal pain.  No nausea, no vomiting.  No diarrhea.  No constipation. Genitourinary: Negative for dysuria. Musculoskeletal: Negative for back pain. Skin: Positive for weeping blisters and bilateral lower extremity swelling. Neurological: Negative for headaches, focal weakness or numbness. ____________________________________________   PHYSICAL EXAM:  VITAL SIGNS: ED Triage Vitals  Enc Vitals Group     BP 12/17/18 1639 122/62     Pulse Rate 12/17/18 1639 99     Resp 12/17/18 1639 16     Temp 12/17/18 1639 97.7 F (36.5 C)     Temp Source 12/17/18 1639 Oral  SpO2 12/17/18 1639 99 %     Weight 12/17/18 1641 160 lb (72.6 kg)     Height 12/17/18 1641 4\' 11"  (1.499 m)     Head Circumference --      Peak Flow --      Pain Score --      Pain Loc --      Pain Edu? --      Excl. in [place]? --     Constitutional: Alert and oriented. Well appearing and in no acute distress. Eyes: Conjunctivae are normal. Head: Atraumatic. Nose: No congestion/rhinnorhea. Mouth/Throat: Mucous membranes are moist.  Oropharynx non-erythematous. Neck: No  stridor.   Cardiovascular: Normal rate, regular rhythm. Grossly normal heart sounds.  Good peripheral circulation. Respiratory: Normal respiratory effort.  No retractions. Lungs CTAB. Gastrointestinal: Soft and nontender. No distention. No abdominal bruits. No CVA tenderness. Musculoskeletal: Pitting edema of the lower extremities with large weeping blisters at the dorsal aspect of the ankles. Neurologic:  Normal speech and language. No gross focal neurologic deficits are appreciated. No gait instability. Skin:  See above. Psychiatric: Mood and affect are normal. Speech and behavior are normal.  ____________________________________________   LABS (all labs ordered are listed, but only abnormal results are displayed)  Labs Reviewed  BASIC METABOLIC PANEL - Abnormal; Notable for the following components:      Result Value   Sodium 132 (*)    CO2 20 (*)    BUN 54 (*)    Creatinine, Ser 2.15 (*)    Calcium 8.7 (*)    GFR calc non Af Amer 19 (*)    GFR calc Af Amer 22 (*)    All other components within normal limits  CBC - Abnormal; Notable for the following components:   RBC 3.61 (*)    Hemoglobin 10.0 (*)    HCT 32.1 (*)    RDW 17.2 (*)    All other components within normal limits  URINALYSIS, COMPLETE (UACMP) WITH MICROSCOPIC - Abnormal; Notable for the following components:   Color, Urine YELLOW (*)    APPearance CLEAR (*)    Leukocytes,Ua TRACE (*)    Bacteria, UA RARE (*)    All other components within normal limits  BRAIN NATRIURETIC PEPTIDE - Abnormal; Notable for the following components:   B Natriuretic Peptide 479.0 (*)    All other components within normal limits  NOVEL CORONAVIRUS, NAA (HOSPITAL ORDER, SEND-OUT TO REF LAB)   ____________________________________________  ____________________________________________  RADIOLOGY  ED MD interpretation:  Cardiomegaly without evidence of infiltrate or consolidation.  Official radiology report(s): Dg Chest 2  View  Result Date: 12/17/2018 CLINICAL DATA:  Peripheral edema EXAM: CHEST - 2 VIEW COMPARISON:  01/07/2018 FINDINGS: Cardiomegaly. No overt edema. No effusions. Linear scarring or atelectasis in the lung bases. No acute bony abnormality. IMPRESSION: Cardiomegaly.  Bibasilar atelectasis or scarring. Electronically Signed   By: Rolm Baptise M.D.   On: 12/17/2018 19:11    ____________________________________________   PROCEDURES  Procedure(s) performed: None  Procedures  Critical Care performed: No  ____________________________________________   INITIAL IMPRESSION / ASSESSMENT AND PLAN / ED COURSE  85 year old female presenting to the ER for treatment and evaluation of lower extremity swelling and decline in renal function. Initial labs show acute kidney injury with BUN of 54 and creatinine of 2.15. Awaiting BNP and chest x-ray. She will require admission.  BNP slightly elevated at 479. Chest x-ray shows some degree of cardiomegaly. Dr. Jannifer Franklin has agreed to bring her into the hospital.  ____________________________________________   FINAL CLINICAL IMPRESSION(S) / ED DIAGNOSES  Final diagnoses:  Acute kidney injury [facility](HCC)     ED Discharge Orders    None       Note:  This document was prepared using Dragon voice recognition software and may include unintentional dictation errors.    Chinita Pesterriplett, Sion Reinders B, FNP 12/17/18 2126    Phineas SemenGoodman, [place], MD 12/17/18 2139

## 2018-12-17 NOTE — ED Notes (Signed)
Patient transported to X-ray 

## 2018-12-17 NOTE — ED Notes (Signed)
[facility] NURSE NOTE: Pt brought to Ed via EMS for abd pain x 1 week that has worsened over the past 2 days. RUQ radiating to the back with vomiting and diarrhea. Pt reporting a fever earlier in the week but pt was afebrile with EMS.   CBG 146 140/80 102 NSR 97.3 F

## 2018-12-17 NOTE — ED Notes (Signed)
Sent green and purple tubes to lab. 

## 2018-12-17 NOTE — ED Notes (Signed)
Per ED charge RN, since pt arrived from nursing home setting ([facility]), she will need a rapid COVID swab prior to admission. New order placed. Lab notified, tech states that previous sample has already been placed in freezer and can't be used. Will need recollect. Primary RN notified.

## 2018-12-17 NOTE — ED Notes (Signed)
ED TO INPATIENT HANDOFF REPORT  ED Nurse Name and Phone #:  Elijah Birkom [phone]  S Name/Age/Gender [name] 85 y.o. female Room/Bed: ED17A/ED17A  Code Status   Code Status: Prior  Home/SNF/Other Nursing Home Patient oriented to: self Is this baseline? Yes   Triage Complete: Triage complete  Chief Complaint blisters on feet  Triage Note Patient sent by PCP after blood showed increased BUN and creatinine. Patient was seen due to swelling in bilateral legs for the past several days as well as blisters forming on the bottom of her legs. Patient was placed on HCTZ at the end of last year for swelling and today was given a dose of lasix as well. Patient complaining of pain in both legs.    Allergies Allergies  Allergen Reactions  . Atorvastatin Other (See Comments)    Muscle pain     Level of Care/Admitting Diagnosis ED Disposition    ED Disposition Condition Comment   Admit  [facility] [[id number]]  Level of Care: Med-Surg [16]  Covid Evaluation: Screening Protocol (No Symptoms)  Diagnosis: AKI (acute kidney injury) [facility](HCC) [[id number]]) [[id number]]  Admitting Physician: Oralia ManisWILLIS, DAVID [[id number]]  Attending Physician: Oralia ManisWILLIS, DAVID [phone][[id number]]  Estimated length of stay: past midnight tomorrow  Certification:: I certify this patient will need inpatient services for at least 2 midnights  PT Class (Do Not Modify): Inpatient [101]  PT Acc Code (Do Not Modify): Private [1]       B Medical/Surgery History Past Medical History:  Diagnosis Date  . Dementia (HCC)   . GERD (gastroesophageal reflux disease)   . Hypertension   . Macular degeneration    Past Surgical History:  Procedure Laterality Date  . ABDOMINAL HYSTERECTOMY    . COLONOSCOPY    . TOTAL HIP ARTHROPLASTY    . TOTAL HIP ARTHROPLASTY Left 06/12/2016   Procedure: left hip hemiarthroplasty anterior approach;  Surgeon: Kennedy BuckerMichael Menz, MD;  Location: [facility] ORS;  Service: Orthopedics;  Laterality: Left;      A IV Location/Drains/Wounds Patient Lines/Drains/Airways Status   Active Line/Drains/Airways    Name:   Placement date:   Placement time:   Site:   Days:   Peripheral IV 12/17/18 Left Forearm   12/17/18    2023    Forearm   less than 1   External Urinary Catheter   12/17/18    1925    -   less than 1   Incision (Closed) 06/12/16 Hip Left   06/12/16    1430     918          Intake/Output Last 24 hours No intake or output data in the 24 hours ending 12/17/18 2244  Labs/Imaging Results for orders placed or performed during the hospital encounter of 12/17/18 (from the past 48 hour(s))  Basic metabolic panel     Status: Abnormal   Collection Time: 12/17/18  5:11 PM  Result Value Ref Range   Sodium 132 (L) 135 - 145 mmol/L   Potassium 4.2 3.5 - 5.1 mmol/L   Chloride 101 98 - 111 mmol/L   CO2 20 (L) 22 - 32 mmol/L   Glucose, Bld 88 70 - 99 mg/dL   BUN 54 (H) 8 - 23 mg/dL   Creatinine, Ser 6.572.15 (H) 0.44 - 1.00 mg/dL   Calcium 8.7 (L) 8.9 - 10.3 mg/dL   GFR calc non Af Amer 19 (L) >60 mL/min   GFR calc Af Amer 22 (L) >60 mL/min   Anion gap 11  5 - 15    Comment: Performed at [facility], [address] [place], [place] [address]  CBC     Status: Abnormal   Collection Time: 12/17/18  5:11 PM  Result Value Ref Range   WBC 7.8 4.0 - 10.5 K/uL   RBC 3.61 (L) 3.87 - 5.11 MIL/uL   Hemoglobin 10.0 (L) 12.0 - 15.0 g/dL   HCT 32.1 (L) 36.0 - 46.0 %   MCV 88.9 80.0 - 100.0 fL   MCH 27.7 26.0 - 34.0 pg   MCHC 31.2 30.0 - 36.0 g/dL   RDW 17.2 (H) 11.5 - 15.5 %   Platelets 236 150 - 400 K/uL   nRBC 0.0 0.0 - 0.2 %    Comment: Performed at [facility], [address] [place], [place] [address]  Urinalysis, Complete w Microscopic     Status: Abnormal   Collection Time: 12/17/18  6:42 PM  Result Value Ref Range   Color, Urine YELLOW (A) YELLOW   APPearance CLEAR (A) CLEAR   Specific Gravity, Urine 1.013 1.005 - 1.030   pH 5.0 5.0 - 8.0   Glucose, UA NEGATIVE  NEGATIVE mg/dL   Hgb urine dipstick NEGATIVE NEGATIVE   Bilirubin Urine NEGATIVE NEGATIVE   Ketones, ur NEGATIVE NEGATIVE mg/dL   Protein, ur NEGATIVE NEGATIVE mg/dL   Nitrite NEGATIVE NEGATIVE   Leukocytes,Ua TRACE (A) NEGATIVE   RBC / HPF 0-5 0 - 5 RBC/hpf   WBC, UA 0-5 0 - 5 WBC/hpf   Bacteria, UA RARE (A) NONE SEEN   Squamous Epithelial / LPF 0-5 0 - 5    Comment: Performed at [facility], [address], [place], [place] [address]  Brain natriuretic peptide     Status: Abnormal   Collection Time: 12/17/18  7:49 PM  Result Value Ref Range   B Natriuretic Peptide 479.0 (H) 0.0 - 100.0 pg/mL    Comment: Performed at [facility], [address] [place], [place] [address]   Dg Chest 2 View  Result Date: 12/17/2018 CLINICAL DATA:  Peripheral edema EXAM: CHEST - 2 VIEW COMPARISON:  01/07/2018 FINDINGS: Cardiomegaly. No overt edema. No effusions. Linear scarring or atelectasis in the lung bases. No acute bony abnormality. IMPRESSION: Cardiomegaly.  Bibasilar atelectasis or scarring. Electronically Signed   By: Rolm Baptise M.D.   On: 12/17/2018 19:11    Pending Labs Unresulted Labs (From admission, onward)    Start     Ordered   12/17/18 2106  Novel Coronavirus,NAA,(SEND-OUT TO REF LAB - TAT 24-48 hrs); Hosp Order  (Asymptomatic Patients Labs)  Once,   STAT    Question:  Rule Out  Answer:  Yes   12/17/18 2105   Signed and Held  CBC  (heparin)  Once,   R    Comments: Baseline for heparin therapy IF NOT ALREADY DRAWN.  Notify MD if PLT < 100 K.    Signed and Held   Signed and Held  Creatinine, serum  (heparin)  Once,   R    Comments: Baseline for heparin therapy IF NOT ALREADY DRAWN.    Signed and Held   Signed and Held  Basic metabolic panel  Tomorrow morning,   R     Signed and Held   Signed and Held  CBC  Tomorrow morning,   R     Signed and Held          Vitals/Pain Today's Vitals   12/17/18 2159 12/17/18 2200 12/17/18 2201 12/17/18  2215  BP:   139/71  (!) 146/78  Pulse: (!) 54 75 86 60  Resp:  18  17  Temp:      TempSrc:      SpO2: 100% 97% 98% 98%  Weight:      Height:        Isolation Precautions No active isolations  Medications Medications  traZODone (DESYREL) tablet 50 mg (50 mg Oral Given 12/17/18 2229)    Mobility manual wheelchair Moderate fall risk   Focused Assessments Cardiac Assessment Handoff:    Lab Results  Component Value Date   TROPONINI <0.03 01/07/2018   No results found for: DDIMER Does the Patient currently have chest pain? No  , Pulmonary Assessment Handoff:  Lung sounds:   O2 Device: Room Air   Pt's daughter states pt usually takes meds whole in applesauce.   R Recommendations: See Admitting Provider Note  Report given to:   Additional Notes:

## 2018-12-18 ENCOUNTER — Other Ambulatory Visit: Payer: Self-pay

## 2018-12-18 ENCOUNTER — Inpatient Hospital Stay (HOSPITAL_COMMUNITY)
Admit: 2018-12-18 | Discharge: 2018-12-18 | Disposition: A | Discharge status: Home / Self Care | Payer: PPO | Attending: Internal Medicine | Admitting: Internal Medicine

## 2018-12-18 ENCOUNTER — Encounter: Payer: Self-pay | Admitting: Internal Medicine

## 2018-12-18 DIAGNOSIS — I361 Nonrheumatic tricuspid (valve) insufficiency: Secondary | ICD-10-CM

## 2018-12-18 DIAGNOSIS — I340 Nonrheumatic mitral (valve) insufficiency: Secondary | ICD-10-CM

## 2018-12-18 LAB — BASIC METABOLIC PANEL
Anion gap: 8 (ref 5–15)
BUN: 49 mg/dL — ABNORMAL HIGH (ref 8–23)
CO2: 21 mmol/L — ABNORMAL LOW (ref 22–32)
Calcium: 8.5 mg/dL — ABNORMAL LOW (ref 8.9–10.3)
Chloride: 105 mmol/L (ref 98–111)
Creatinine, Ser: 1.67 mg/dL — ABNORMAL HIGH (ref 0.44–1.00)
GFR calc Af Amer: 30 mL/min — ABNORMAL LOW (ref >60)
GFR calc non Af Amer: 26 mL/min — ABNORMAL LOW (ref >60)
Glucose, Bld: 81 mg/dL (ref 70–99)
Potassium: 4 mmol/L (ref 3.5–5.1)
Sodium: 134 mmol/L — ABNORMAL LOW (ref 135–145)

## 2018-12-18 LAB — CBC
HCT: 30.5 % — ABNORMAL LOW (ref 36.0–46.0)
Hemoglobin: 9.5 g/dL — ABNORMAL LOW (ref 12.0–15.0)
MCH: 27.6 pg (ref 26.0–34.0)
MCHC: 31.1 g/dL (ref 30.0–36.0)
MCV: 88.7 fL (ref 80.0–100.0)
Platelets: 234 10*3/uL (ref 150–400)
RBC: 3.44 MIL/uL — ABNORMAL LOW (ref 3.87–5.11)
RDW: 17.2 % — ABNORMAL HIGH (ref 11.5–15.5)
WBC: 6.8 10*3/uL (ref 4.0–10.5)
nRBC: 0 % (ref 0.0–0.2)

## 2018-12-18 LAB — SARS CORONAVIRUS 2 BY RT PCR (HOSPITAL ORDER, PERFORMED IN ~~LOC~~ HOSPITAL LAB): SARS Coronavirus 2: NEGATIVE

## 2018-12-18 LAB — ECHOCARDIOGRAM COMPLETE
Height: 59 in
Weight: 2560 oz

## 2018-12-18 LAB — ALBUMIN: Albumin: 3 g/dL — ABNORMAL LOW (ref 3.5–5.0)

## 2018-12-18 LAB — MRSA PCR SCREENING: MRSA by PCR: NEGATIVE

## 2018-12-18 MED ORDER — CALCIUM CARBONATE ANTACID 500 MG PO CHEW: 1.0000 | CHEWABLE_TABLET | Freq: Two times a day (BID) | ORAL | Status: DC | PRN

## 2018-12-18 MED ORDER — ACETAMINOPHEN 325 MG PO TABS
650.0000 mg | ORAL_TABLET | Freq: Four times a day (QID) | ORAL | Status: DC
  Administered 2018-12-18 – 2018-12-19 (×5): 650 mg via ORAL
  Filled 2018-12-18 (×5): qty 2

## 2018-12-18 MED ORDER — POLYVINYL ALCOHOL 1.4 % OP SOLN
1.0000 [drp] | Freq: Two times a day (BID) | OPHTHALMIC | Status: DC
  Administered 2018-12-18 (×2): 1 [drp] via OPHTHALMIC
  Filled 2018-12-18: qty 15

## 2018-12-18 MED ORDER — ACETAMINOPHEN 650 MG RE SUPP: 650.0000 mg | Freq: Four times a day (QID) | RECTAL | Status: DC

## 2018-12-18 MED ORDER — ONDANSETRON HCL 4 MG/2ML IJ SOLN: 4.0000 mg | Freq: Four times a day (QID) | INTRAMUSCULAR | Status: DC | PRN

## 2018-12-18 MED ORDER — PANTOPRAZOLE SODIUM 40 MG PO TBEC
40.0000 mg | DELAYED_RELEASE_TABLET | Freq: Every day | ORAL | Status: DC
  Administered 2018-12-18 – 2018-12-19 (×2): 40 mg via ORAL
  Filled 2018-12-18 (×2): qty 1

## 2018-12-18 MED ORDER — ACETAMINOPHEN 325 MG PO TABS: 650.0000 mg | ORAL_TABLET | Freq: Four times a day (QID) | ORAL | Status: DC | PRN

## 2018-12-18 MED ORDER — TRAZODONE HCL 50 MG PO TABS
150.0000 mg | ORAL_TABLET | Freq: Every day | ORAL | Status: DC
  Administered 2018-12-18: 22:00:00 150 mg via ORAL
  Filled 2018-12-18: qty 3

## 2018-12-18 MED ORDER — TRAMADOL HCL 50 MG PO TABS
50.0000 mg | ORAL_TABLET | Freq: Two times a day (BID) | ORAL | Status: DC
  Administered 2018-12-18 – 2018-12-19 (×3): 50 mg via ORAL
  Filled 2018-12-18 (×3): qty 1

## 2018-12-18 MED ORDER — SODIUM CHLORIDE 0.9 % IV SOLN
INTRAVENOUS | Status: AC
  Administered 2018-12-18 (×2): via INTRAVENOUS

## 2018-12-18 MED ORDER — LORAZEPAM 0.5 MG PO TABS: 0.5000 mg | ORAL_TABLET | Freq: Three times a day (TID) | ORAL | Status: DC | PRN

## 2018-12-18 MED ORDER — DONEPEZIL HCL 5 MG PO TABS
10.0000 mg | ORAL_TABLET | Freq: Every day | ORAL | Status: DC
  Administered 2018-12-18: 22:00:00 10 mg via ORAL
  Filled 2018-12-18: qty 2

## 2018-12-18 MED ORDER — LIDOCAINE HCL 4 % EX SOLN
1.0000 mL | Freq: Two times a day (BID) | CUTANEOUS | Status: DC | PRN
  Filled 2018-12-18: qty 50

## 2018-12-18 MED ORDER — HEPARIN SODIUM (PORCINE) 5000 UNIT/ML IJ SOLN
5000.0000 [IU] | Freq: Three times a day (TID) | INTRAMUSCULAR | Status: DC
  Administered 2018-12-18 – 2018-12-19 (×4): 5000 [IU] via SUBCUTANEOUS
  Filled 2018-12-18 (×4): qty 1

## 2018-12-18 MED ORDER — ONDANSETRON HCL 4 MG PO TABS: 4.0000 mg | ORAL_TABLET | Freq: Four times a day (QID) | ORAL | Status: DC | PRN

## 2018-12-18 MED ORDER — ACETAMINOPHEN 650 MG RE SUPP: 650.0000 mg | Freq: Four times a day (QID) | RECTAL | Status: DC | PRN

## 2018-12-18 MED ORDER — DOCUSATE SODIUM 100 MG PO CAPS
100.0000 mg | ORAL_CAPSULE | Freq: Every day | ORAL | Status: DC
  Administered 2018-12-18 – 2018-12-19 (×2): 100 mg via ORAL
  Filled 2018-12-18 (×2): qty 1

## 2018-12-18 MED ORDER — ASPIRIN EC 81 MG PO TBEC
81.0000 mg | DELAYED_RELEASE_TABLET | Freq: Every day | ORAL | Status: DC
  Administered 2018-12-18 – 2018-12-19 (×2): 81 mg via ORAL
  Filled 2018-12-18 (×2): qty 1

## 2018-12-18 NOTE — Progress Notes (Signed)
Advance care planning  Purpose of Encounter AKI, dementia  Parties in Attendance Patient, Daughter - [name]  Patients Decisional capacity Patient with dementia and unable to make medical decisions.  Daughter is the healthcare power of attorney.  Discussed in detail regarding dementia, AKI, chronic pain.  Treatment plan , prognosis discussed.  All questions answered.  Daughter requests palliative care follow-up as outpatient.  Home health services at discharge.  CODE STATUS is Full code  Time spent - 17 minutes

## 2018-12-18 NOTE — Plan of Care (Signed)
  Problem: Health Behavior/Discharge Planning: Goal: Ability to manage health-related needs will improve Outcome: Progressing   Problem: Clinical Measurements: Goal: Will remain free from infection Outcome: Progressing Goal: Respiratory complications will improve Outcome: Progressing   Problem: Activity: Goal: Risk for activity intolerance will decrease Outcome: Progressing   Problem: Nutrition: Goal: Adequate nutrition will be maintained Outcome: Progressing   Problem: Coping: Goal: Level of anxiety will decrease Outcome: Progressing   Problem: Elimination: Goal: Will not experience complications related to bowel motility Outcome: Progressing   Problem: Pain Managment: Goal: General experience of comfort will improve Outcome: Progressing   Problem: Safety: Goal: Ability to remain free from injury will improve Outcome: Progressing   

## 2018-12-18 NOTE — Progress Notes (Signed)
*  PRELIMINARY RESULTS* Echocardiogram 2D Echocardiogram has been performed.  Wallie Char Stevens [place] 12/18/2018, 9:59 AM

## 2018-12-18 NOTE — Progress Notes (Signed)
[facility] [place] NAME: [name]    MR#:  [id number]  DATE OF BIRTH:  Feb 19, 1924  SUBJECTIVE:  CHIEF COMPLAINT:   Chief Complaint  Patient presents with  . Blister  . Leg Swelling   Dementia.  Pleasant but unable to contribute much to history.  Daughter at bedside mentions good food intake but decreased fluid intake. Has chronic lower extremity swelling which has worsened over the last few days.   REVIEW OF SYSTEMS:    Review of Systems  Unable to perform ROS: Dementia    DRUG ALLERGIES:   Allergies  Allergen Reactions  . Atorvastatin Other (See Comments)    Muscle pain     VITALS:  Blood pressure (!) 105/56, pulse (!) 55, temperature (!) 97.5 F (36.4 C), resp. rate 16, height 4\' 11"  (1.499 m), weight 72.6 kg, SpO2 98 %.  PHYSICAL EXAMINATION:   Physical Exam  GENERAL:  85 y.o.-year-old patient lying in the bed with no acute distress.  EYES: Pupils equal, round, reactive to light and accommodation. No scleral icterus. Extraocular muscles intact.  HEENT: Head atraumatic, normocephalic. Oropharynx and nasopharynx clear.  NECK:  Supple, no jugular venous distention. No thyroid enlargement, no tenderness.  LUNGS: Normal breath sounds bilaterally, no wheezing, rales, rhonchi. No use of accessory muscles of respiration.  CARDIOVASCULAR: S1, S2 normal. No murmurs, rubs, or gallops.  ABDOMEN: Soft, nontender, nondistended. Bowel sounds present. No organomegaly or mass.  EXTREMITIES: Bilateral lower extremity edema NEUROLOGIC: Cranial nerves II through XII are intact. No focal Motor or sensory deficits b/l.   PSYCHIATRIC: The patient is alert and awake.  Pleasantly confused SKIN: Skin tears on bilateral lower legs  LABORATORY PANEL:   CBC Recent Labs  Lab 12/18/18 0505  WBC 6.8  HGB 9.5*  HCT 30.5*  PLT 234    ------------------------------------------------------------------------------------------------------------------ Chemistries  Recent Labs  Lab 12/18/18 0505  NA 134*  K 4.0  CL 105  CO2 21*  GLUCOSE 81  BUN 49*  CREATININE 1.67*  CALCIUM 8.5*   ------------------------------------------------------------------------------------------------------------------  Cardiac Enzymes No results for input(s): TROPONINI in the last 168 hours. ------------------------------------------------------------------------------------------------------------------  RADIOLOGY:  Dg Chest 2 View  Result Date: 12/17/2018 CLINICAL DATA:  Peripheral edema EXAM: CHEST - 2 VIEW COMPARISON:  01/07/2018 FINDINGS: Cardiomegaly. No overt edema. No effusions. Linear scarring or atelectasis in the lung bases. No acute bony abnormality. IMPRESSION: Cardiomegaly.  Bibasilar atelectasis or scarring. Electronically Signed   By: Rolm Baptise M.D.   On: 12/17/2018 19:11     ASSESSMENT AND PLAN:   * AKI   Likely due to poor oral intake.  Continue IV fluids at 50 mL/h monitor input and output   * Peripheral edema -no prior diagnosis of CHF Echocardiogram pending. Unable to give any diuretics due to acute kidney injury   * HTN (hypertension) Hydrochlorothiazide due to acute kidney injury    GERD (gastroesophageal reflux disease) -home dose PPI   * Dementia  On Namenda.  Increase trazodone at night.  *Chronic pain.  Resume scheduled Tylenol.  Will hold ibuprofen and start patient on tramadol.  All the records are reviewed and case discussed with Care Management/Social Worker Management plans discussed with the patient, family and they are in agreement.  CODE STATUS: DNR/DNI  TOTAL TIME TAKING CARE OF THIS PATIENT: 30 minutes.   POSSIBLE D/C IN 1-2 DAYS, DEPENDING ON CLINICAL CONDITION.  Leia Alf Raylan Hanton M.D on 12/18/2018 at 12:55 PM  Between 7am to 6pm -  Pager - [phone]  After 6pm go to  [email or web address] - password [email or web address]  [facility]  Office  [phone]  CC: Primary care physician; Dorothey BasemanBronstein, David, MD  Note: This dictation was prepared with Dragon dictation along with smaller phrase technology. Any transcriptional errors that result from this process are unintentional.

## 2018-12-18 NOTE — Consult Note (Signed)
[facility] Nurse wound consult note Reason for Consult: Consult requested for bilat legs. Progress notes indicate she developed blisters and swelling prior to admission; appearance has improved at the time of this assessment.  Wound type: Previous clear fluid-filled blisters have ruptured and evolved into partial thickness wounds to left and right anterior calves.  Measurement: Left leg 3X5X.1cm, Right leg 4X7X.1cm Wound bed: pink moist woundbeds Drainage (amount, consistency, odor) no further drainage, no odor Periwound: generalized edema and erythremia surrounding Dressing procedure/placement/frequency: Foam dressings to protect and promote healing. Please re-consult if further assistance is needed.  Thank-you,  Julien Girt MSN, [place], [place], [place], [place]

## 2018-12-18 NOTE — Progress Notes (Signed)
New referral for Palliative to follow at [facility] on [address] in [place] received from attending physician Dr. Darvin Neighbours. CSW [place] aware. Patient information faxed to Palliative referral. Flo Shanks BSN, RN, [place] [phone]

## 2018-12-18 NOTE — TOC Initial Note (Signed)
Transition of Care [facility]) - Initial/Assessment Note    Patient Details  Name: [name] MRN: [id number] Date of Birth: August 16, 1923  Transition of Care [facility]) CM/SW Contact:    Haleema Vanderheyden, Lenice Llamas Phone Number: [phone]  12/18/2018, 11:45 AM  Clinical Narrative:  Patient is from [facility]. Per [employer] administrator patient's ALF is located at [place], [place]. Per Thayer Headings patient walks with a walker at baseline and [facility] contact is Tammy [phone]. Per Thayer Headings patient can return to [facility] when stable. Clinical Social Worker (CSW) met with patient and her daughter Melanie Crazier was at bedside. Ginny requested [place] to see patient when she returns to [facility]. [place] requested a palliative consult at [facility], MD aware of above. Ginny requested for outpatient palliative to follow at [facility]. [place] liaison is aware of above. CSW contacted [facility] representative and made him aware of above. Per Corene Cornea he will review referral and call CSW back about their decision. CSW will continue to follow and assist as needed.                 Expected Discharge Plan: Assisted Living Barriers to Discharge: Continued Medical Work up   Patient Goals and CMS Choice Patient states their goals for this hospitalization and ongoing recovery are:: Improve leg swelling      Expected Discharge Plan and Services Expected Discharge Plan: Assisted Living In-house Referral: Clinical Social Work Discharge Planning Services: CM Consult Post Acute Care Choice: [place] arrangements for the past 2 months: [place] Expected Discharge Date: 12/19/18                         HH Arranged: PT [place]: [place] ([facility]) Date HH Agency Contacted: 12/18/18   Representative spoke with at [place]: [facility] is reviewing referral. I'm waiting on their decision.  Prior  Living Arrangements/Services Living arrangements for the past 2 months: [place] Lives with:: Facility Resident Patient language and need for interpreter reviewed:: No Do you feel safe going back to the place where you live?: Yes      Need for Family Participation in Patient Care: Yes (Comment) Care giver support system in place?: Yes (comment)   Criminal Activity/Legal Involvement Pertinent to Current Situation/Hospitalization: No - Comment as needed  Activities of Daily Living Home Assistive Devices/Equipment: Walker (specify type) ADL Screening (condition at time of admission) Patient's cognitive ability adequate to safely complete daily activities?: Yes Is the patient deaf or have difficulty hearing?: Yes(hearing aids in left ear) Does the patient have difficulty seeing, even when wearing glasses/contacts?: Yes Does the patient have difficulty concentrating, remembering, or making decisions?: No Patient able to express need for assistance with ADLs?: Yes Does the patient have difficulty dressing or bathing?: Yes Independently performs ADLs?: No Communication: Independent Dressing (OT): Needs assistance Is this a change from baseline?: Pre-admission baseline Grooming: Needs assistance Is this a change from baseline?: Pre-admission baseline Feeding: Independent Bathing: Needs assistance Is this a change from baseline?: Pre-admission baseline Toileting: Needs assistance Is this a change from baseline?: Pre-admission baseline In/Out Bed: Needs assistance Is this a change from baseline?: Pre-admission baseline Walks in Home: Needs assistance Is this a change from baseline?: Pre-admission baseline Does the patient have difficulty walking or climbing stairs?: Yes Weakness of Legs: Both Weakness of Arms/Hands: None  Permission Sought/Granted Permission  sought to share information with : (home health agency) Permission granted to share information with : Yes, Verbal  Permission Granted              Emotional Assessment Appearance:: Appears stated age   Affect (typically observed): Accepting, Pleasant, Quiet Orientation: : Oriented to Self, Oriented to Place, Fluctuating Orientation (Suspected and/or reported Sundowners), Oriented to Situation Alcohol / Substance Use: Not Applicable Psych Involvement: No (comment)  Admission diagnosis:  Acute kidney injury [facility]) [N17.9] Patient Active Problem List   Diagnosis Date Noted  . AKI (acute kidney injury) ([place]) 12/17/2018  . HTN (hypertension) 12/17/2018  . GERD (gastroesophageal reflux disease) 12/17/2018  . Dementia ([employer]) 12/17/2018  . Peripheral edema 12/17/2018  . Hip fracture ([place]) 06/12/2016   PCP:  Juluis Pitch, MD Pharmacy:   [place], [address] [place] [address] Phone: [phone] Fax: [phone]     Social Determinants of Health (SDOH) Interventions    Readmission Risk Interventions No flowsheet data found.

## 2018-12-18 NOTE — TOC Progression Note (Signed)
Transition of Care [facility] - Progression Note    Patient Details  Name: [name] MRN: [id number] Date of Birth: January 15, 1924  Transition of Care [facility]) CM/SW Contact  Edwar Coe, Lenice Llamas Phone Number: [phone]  12/18/2018, 2:01 PM  Clinical Narrative: Per [place] representative they can accept patient for home health. Per Corene Cornea they can't start care over the weekend and will likely start care Monday 7/6. Patient's daughter Donia Guiles is aware of above.     Expected Discharge Plan: Assisted Living Barriers to Discharge: Continued Medical Work up  Expected Discharge Plan and Services Expected Discharge Plan: Assisted Living In-house Referral: Clinical Social Work Discharge Planning Services: CM Consult Post Acute Care Choice: [place] arrangements for the past 2 months: [place] Expected Discharge Date: 12/19/18                         HH Arranged: PT [place]: [place] ([facility]) Date HH Agency Contacted: 12/18/18   Representative spoke with at [place]: [facility] is reviewing referral. I'm waiting on their decision.   Social Determinants of Health (SDOH) Interventions    Readmission Risk Interventions No flowsheet data found.

## 2018-12-18 NOTE — NC FL2 (Signed)
[place] MEDICAID FL2 LEVEL OF CARE SCREENING TOOL     IDENTIFICATION  Patient Name: [name] Birthdate: 13-May-1924 Sex: female Admission Date (Current Location): 12/17/2018  [place]   Facility and Address:  [facility], [address], [place], [place] [address]      Provider Number: [id number]  Attending Physician Name and Address:  Milagros LollSudini, Srikar, MD  Relative Name and Phone Number:       Current Level of Care: Hospital Recommended Level of Care: Assisted Living Facility Prior Approval Number:    Date Approved/Denied:   PASRR Number:    Discharge Plan: Domiciliary (Rest home)    Current Diagnoses: Patient Active Problem List   Diagnosis Date Noted  . AKI (acute kidney injury) (HCC) 12/17/2018  . HTN (hypertension) 12/17/2018  . GERD (gastroesophageal reflux disease) 12/17/2018  . Dementia (HCC) 12/17/2018  . Peripheral edema 12/17/2018  . Hip fracture (HCC) 06/12/2016    Orientation RESPIRATION BLADDER Height & Weight     Self, Situation, Place  Normal Incontinent Weight: 160 lb (72.6 kg) Height:  4\' 11"  (149.9 cm)  BEHAVIORAL SYMPTOMS/MOOD NEUROLOGICAL BOWEL NUTRITION STATUS      Continent Diet(Diet: Heart Healthy)  AMBULATORY STATUS COMMUNICATION OF NEEDS Skin   Extensive Assist Verbally Normal                       Personal Care Assistance Level of Assistance  Bathing, Feeding, Dressing Bathing Assistance: Limited assistance Feeding assistance: Limited assistance Dressing Assistance: Limited assistance     Functional Limitations Info  Sight, Hearing, Speech Sight Info: Adequate Hearing Info: Impaired Speech Info: Adequate    SPECIAL CARE FACTORS FREQUENCY  PT (By licensed PT)     PT Frequency: 2-3 home health              Contractures      Additional Factors Info  Code Status, Allergies(Outpatient palliative to follow) Code Status Info: DNR Allergies Info:  Atorvastatin           Current Medications (12/18/2018):  This is the current hospital active medication list Current Facility-Administered Medications  Medication Dose Route Frequency Provider Last Rate Last Dose  . 0.9 %  sodium chloride infusion   Intravenous Continuous Milagros LollSudini, Srikar, MD 50 mL/hr at 12/18/18 0219    . acetaminophen (TYLENOL) tablet 650 mg  650 mg Oral Q6H Sudini, Srikar, MD   650 mg at 12/18/18 1133   Or  . acetaminophen (TYLENOL) suppository 650 mg  650 mg Rectal Q6H Sudini, Srikar, MD      . aspirin EC tablet 81 mg  81 mg Oral Daily Oralia ManisWillis, David, MD   81 mg at 12/18/18 1133  . heparin injection 5,000 Units  5,000 Units Subcutaneous [place] OraQ8H Willis, David, MD   5,000 Units at 12/18/18 0535  . ondansetron (ZOFRAN) tablet 4 mg  4 mg Oral Q6H PRN Oralia ManisWillis, David, MD       Or  . ondansetron [facility](ZOFRAN) injection 4 mg  4 mg Intravenous Q6H PRN Oralia ManisWillis, David, MD      . pantoprazole (PROTONIX) EC tablet 40 mg  40 mg Oral Daily Oralia ManisWillis, David, MD   40 mg at 12/18/18 1133  . traMADol (ULTRAM) tablet 50 mg  50 mg Oral BID Milagros LollSudini, Srikar, MD   50 mg at 12/18/18 1133  . traZODone (DESYREL) tablet 150 mg  150 mg Oral QHS Milagros LollSudini, Srikar, MD  Discharge Medications: Please see discharge summary for a list of discharge medications.  Relevant Imaging Results:  Relevant Lab Results:   Additional Information SSN: [id number]  Carsynn Bethune, Veronia Beets, [place]

## 2018-12-19 ENCOUNTER — Encounter: Payer: Self-pay | Admitting: Internal Medicine

## 2018-12-19 LAB — BASIC METABOLIC PANEL
Anion gap: 8 (ref 5–15)
BUN: 43 mg/dL — ABNORMAL HIGH (ref 8–23)
CO2: 20 mmol/L — ABNORMAL LOW (ref 22–32)
Calcium: 8.5 mg/dL — ABNORMAL LOW (ref 8.9–10.3)
Chloride: 110 mmol/L (ref 98–111)
Creatinine, Ser: 1.58 mg/dL — ABNORMAL HIGH (ref 0.44–1.00)
GFR calc Af Amer: 33 mL/min — ABNORMAL LOW (ref >60)
GFR calc non Af Amer: 28 mL/min — ABNORMAL LOW (ref >60)
Glucose, Bld: 87 mg/dL (ref 70–99)
Potassium: 4 mmol/L (ref 3.5–5.1)
Sodium: 138 mmol/L (ref 135–145)

## 2018-12-19 LAB — NOVEL CORONAVIRUS, NAA (HOSP ORDER, SEND-OUT TO REF LAB; TAT 18-24 HRS): SARS-CoV-2, NAA: NOT DETECTED

## 2018-12-19 MED ORDER — TRAZODONE HCL 150 MG PO TABS: 150.0000 mg | ORAL_TABLET | Freq: Every day | ORAL | 0 refills | Status: AC

## 2018-12-19 MED ORDER — TRAMADOL HCL 50 MG PO TABS: 50.0000 mg | ORAL_TABLET | Freq: Two times a day (BID) | ORAL | 0 refills | Status: AC

## 2018-12-19 MED ORDER — FUROSEMIDE 20 MG PO TABS: 20.0000 mg | ORAL_TABLET | Freq: Every day | ORAL | 0 refills | Status: AC | PRN

## 2018-12-19 NOTE — [place] FL2 (Signed)
[place] MEDICAID FL2 LEVEL OF CARE SCREENING TOOL     IDENTIFICATION  Patient Name: [name] Birthdate: 1923/11/24 Sex: female Admission Date (Current Location): 12/17/2018  [place]   Facility and Address:  [facility], [address], [place], [place] [address]      Provider Number: [id number]  Attending Physician Name and Address:  Milagros LollSudini, Srikar, MD  Relative Name and Phone Number:       Current Level of Care: Hospital Recommended Level of Care: Assisted Living Facility Prior Approval Number:    Date Approved/Denied:   PASRR Number:    Discharge Plan: Domiciliary (Rest home)    Current Diagnoses: Patient Active Problem List   Diagnosis Date Noted  . AKI (acute kidney injury) (HCC) 12/17/2018  . HTN (hypertension) 12/17/2018  . GERD (gastroesophageal reflux disease) 12/17/2018  . Dementia (HCC) 12/17/2018  . Peripheral edema 12/17/2018  . Hip fracture (HCC) 06/12/2016    Orientation RESPIRATION BLADDER Height & Weight     Self, Situation, Place  Normal Incontinent Weight: 72.6 kg Height:  4\' 11"  (149.9 cm)  BEHAVIORAL SYMPTOMS/MOOD NEUROLOGICAL BOWEL NUTRITION STATUS      Continent Diet(Diet: Heart Healthy)  AMBULATORY STATUS COMMUNICATION OF NEEDS Skin   Extensive Assist Verbally Normal                       Personal Care Assistance Level of Assistance  Bathing, Feeding, Dressing Bathing Assistance: Limited assistance Feeding assistance: Limited assistance Dressing Assistance: Limited assistance     Functional Limitations Info  Sight, Hearing, Speech Sight Info: Adequate Hearing Info: Impaired Speech Info: Adequate    SPECIAL CARE FACTORS FREQUENCY  PT (By licensed PT)     PT Frequency: 2-3 home health              Contractures      Additional Factors Info  Code Status, Allergies(Outpatient palliative to follow) Code Status Info: DNR Allergies Info: Atorvastatin            Current Medications (12/19/2018):  This is the current hospital active medication list Current Facility-Administered Medications  Medication Dose Route Frequency Provider Last Rate Last Dose  . acetaminophen (TYLENOL) tablet 650 mg  650 mg Oral Q6H Sudini, Srikar, MD   650 mg at 12/19/18 1006   Or  . acetaminophen (TYLENOL) suppository 650 mg  650 mg Rectal Q6H Sudini, Srikar, MD      . aspirin EC tablet 81 mg  81 mg Oral Daily Oralia ManisWillis, David, MD   81 mg at 12/19/18 1006  . calcium carbonate (TUMS - dosed in mg elemental calcium) chewable tablet 200 mg of elemental calcium  1 tablet Oral BID PRN Sudini, Wardell HeathSrikar, MD      . docusate sodium (COLACE) capsule 100 mg  100 mg Oral Daily Sudini, Wardell HeathSrikar, MD   100 mg at 12/19/18 1006  . donepezil (ARICEPT) tablet 10 mg  10 mg Oral QHS Milagros LollSudini, Srikar, MD   10 mg at 12/18/18 2151  . heparin injection 5,000 Units  5,000 Units Subcutaneous Willow OraQ8H Willis, David, MD   5,000 Units at 12/19/18 [phone]  . lidocaine (XYLOCAINE) 4 % external solution 1 mL  1 mL Topical BID PRN Sudini, Srikar, MD      . LORazepam (ATIVAN) tablet 0.5 mg  0.5 mg Oral Q8H PRN Sudini, Srikar, MD      . ondansetron [facility](ZOFRAN) tablet 4 mg  4 mg Oral Q6H PRN Oralia ManisWillis, David,  MD       Or  . ondansetron (ZOFRAN) injection 4 mg  4 mg Intravenous Q6H PRN Lance Coon, MD      . pantoprazole (PROTONIX) EC tablet 40 mg  40 mg Oral Daily Lance Coon, MD   40 mg at 12/19/18 1006  . polyvinyl alcohol (LIQUIFILM TEARS) 1.4 % ophthalmic solution 1 drop  1 drop Both Eyes BID Hillary Bow, MD   1 drop at 12/18/18 2155  . traMADol (ULTRAM) tablet 50 mg  50 mg Oral BID Hillary Bow, MD   50 mg at 12/19/18 1006  . traZODone (DESYREL) tablet 150 mg  150 mg Oral QHS Hillary Bow, MD   150 mg at 12/18/18 2151     Discharge Medications: Please see discharge summary for a list of discharge medications.  Relevant Imaging Results:  Relevant Lab Results:   Additional Information SSN:  [id number]  Shelbie Hutching, RN

## 2018-12-19 NOTE — Discharge Instructions (Addendum)
Check weight every day. If > 3 lb weight gain give Lasix 20 mg PO x 1.  Check BMP ion 12/23/2018.  STOP IBUPROFEN/ADVIL

## 2018-12-19 NOTE — TOC Transition Note (Signed)
Transition of Care [facility]) - CM/SW Discharge Note   Patient Details  Name: LEILANIE RAUDA MRN: [id number] Date of Birth: 1924/02/10  [facility]) CM/SW Contact:  Shelbie Hutching, RN Phone Number: 12/19/2018, 11:52 AM   Clinical Narrative:    Patient returning to [place] today.  Daughter will transport at discharge.  Discharge summary and FL2 included in discharge packet.     Final next level of care: Assisted Living Barriers to Discharge: Barriers Resolved   Patient Goals and CMS Choice Patient states their goals for this hospitalization and ongoing recovery are:: Return to [facility] with home health      Discharge Placement              Patient chooses bed at: [facility]. Patient to be transferred to facility by: Daughter Name of family member notified: Melanie Crazier- daughter Patient and family notified of of transfer: 12/19/18  Discharge Plan and Services In-house Referral: Clinical Social Work Discharge Planning Services: CM Consult Post Acute Care Choice: [place]: PT [place]: [place] ([place]) Date [place]: 12/19/18 Time [place]: 1150 Representative spoke with at [place]: [place] (SDOH) Interventions     Readmission Risk Interventions No flowsheet data found.

## 2018-12-19 NOTE — Discharge Summary (Signed)
[facility] [place] NAME: [name]    MR#:  [id number]  DATE OF BIRTH:  Oct 23, 1923  DATE OF ADMISSION:  12/17/2018 ADMITTING PHYSICIAN: Lance Coon, MD  DATE OF DISCHARGE: 12/19/2018  PRIMARY CARE PHYSICIAN: Juluis Pitch, MD   ADMISSION DIAGNOSIS:  Acute kidney injury ([place]) [N17.9]  DISCHARGE DIAGNOSIS:  Principal Problem:   AKI (acute kidney injury) ([place]) Active Problems:   HTN (hypertension)   GERD (gastroesophageal reflux disease)   Dementia (HCC)   Peripheral edema   SECONDARY DIAGNOSIS:   Past Medical History:  Diagnosis Date  . Dementia ([place])   . GERD (gastroesophageal reflux disease)   . Hypertension   . Macular degeneration      ADMITTING HISTORY  [name]  is a 85 y.o. female who presents with chief complaint as above.  Patient brought to the ED with a complaint of lower extremity edema.  Here she is found to have significant AKI.  Per report she has had very poor p.o. intake recently, likely due to depression from restriction on family visitations at her facility.  She does have significant pitting edema on her lower extremities, including with some blistering and skin tear.  Hospitalist were called for admission and further evaluation  HOSPITAL COURSE:    *AKI   Creatinine 2.17 --> 1.67 -->154 Likely due to poor oral intake.   On IV 48ml/hr in hospital and improved. HCTZ held  At discharge I have continued HCTZ due to LE swelling still being as issue Repeat BMP in 4 days after discharge. If creatinine worsens please stop HCTZ  *Peripheral edema -no prior diagnosis of CHF But here echo shows EF >65% with Moderate MR and dilated Left atrium Unable to give any diuretics due to acute kidney injury Started Lasix PRN for > 3 lb weight gain She does have chronic diastolic chf  Edema due to CHF, hypoalbuminemia and immobility  *HTN (hypertension) Hydrochlorothiazide held due to acute kidney  injury Restart at discharge  GERD (gastroesophageal reflux disease) -home dose PPI  *Dementia  On Namenda.  Increased trazodone at night to 150 mg  *Chronic pain.  Resumed scheduled Tylenol.  Will hold ibuprofen and started patient on tramadol.prescription given  Stable for discharge back to ALF with home health and [place] AS OUTPATIENT  CONSULTS OBTAINED:    DRUG ALLERGIES:   Allergies  Allergen Reactions  . Atorvastatin Other (See Comments)    Muscle pain     DISCHARGE MEDICATIONS:   Allergies as of 12/19/2018      Reactions   Atorvastatin Other (See Comments)   Muscle pain       Medication List    TAKE these medications   Aspercreme Lidocaine 4 % Liqd Generic drug: Lidocaine HCl Apply 1 application topically 2 (two) times daily as needed. to legs   aspirin EC 81 MG tablet Take 81 mg by mouth daily.   docusate sodium 100 MG capsule Commonly known as: COLACE Take 1 capsule (100 mg total) by mouth 2 (two) times daily. What changed: when to take this   donepezil 10 MG tablet Commonly known as: ARICEPT Take 10 mg by mouth at bedtime.   furosemide 20 MG tablet Commonly known as: Lasix Take 1 tablet (20 mg total) by mouth daily as needed. Daily As needed if > 3 lb weight gain   glycerin adult 2 g suppository Place 1 suppository rectally as needed for constipation.   hydrochlorothiazide 12.5 MG  tablet Commonly known as: HYDRODIURIL Take 12.5 mg by mouth daily.   LORazepam 0.5 MG tablet Commonly known as: ATIVAN Take 0.5 mg by mouth every 8 (eight) hours as needed for anxiety.   metoprolol tartrate 50 MG tablet Commonly known as: LOPRESSOR   Ocuvite Adult 50+ Caps Take 1 capsule by mouth daily.   omeprazole 20 MG capsule Commonly known as: PRILOSEC Take 20 mg by mouth daily.   OPTIVE 0.5-0.9 % ophthalmic solution Generic drug: carboxymethylcellul-glycerin Place 1 drop into both eyes 2 (two) times daily.   traMADol 50 MG  tablet Commonly known as: ULTRAM Take 1 tablet (50 mg total) by mouth 2 (two) times a day.   traZODone 150 MG tablet Commonly known as: DESYREL Take 1 tablet (150 mg total) by mouth at bedtime. What changed:   medication strength  how much to take   triamcinolone cream 0.1 % Commonly known as: KENALOG Apply 1 application topically 2 (two) times daily as needed. Under the breast an on the abdominal area   TUMS PO Take 1 tablet by mouth 2 (two) times daily as needed (chew and swallow antiacid).   calcium carbonate 750 MG chewable tablet Commonly known as: TUMS EX Chew 1 tablet by mouth every evening. chew and swallow   vitamin B-12 1000 MCG tablet Commonly known as: CYANOCOBALAMIN Take 1 tablet by mouth daily.       Today   VITAL SIGNS:  Blood pressure 114/68, pulse 91, temperature 98 F (36.7 C), temperature source Tympanic, resp. rate 16, height 4\' 11"  (1.499 m), weight 72.6 kg, SpO2 100 %.  I/O:    Intake/Output Summary (Last 24 hours) at 12/19/2018 1044 Last data filed at 12/19/2018 0957 Gross per 24 hour  Intake 600 ml  Output 800 ml  Net -200 ml    PHYSICAL EXAMINATION:  Physical Exam  GENERAL:  85 y.o.-year-old patient lying in the bed with no acute distress.  LUNGS: Normal breath sounds bilaterally, no wheezing, rales,rhonchi or crepitation. No use of accessory muscles of respiration.  CARDIOVASCULAR: S1, S2 normal. No murmurs, rubs, or gallops.  ABDOMEN: Soft, non-tender, non-distended. Bowel sounds present. No organomegaly or mass.  NEUROLOGIC: Moves all 4 extremities. PSYCHIATRIC: The patient is alert and oriented x 3.  SKIN: No obvious rash, lesion, or ulcer.   DATA REVIEW:   CBC Recent Labs  Lab 12/18/18 0505  WBC 6.8  HGB 9.5*  HCT 30.5*  PLT 234    Chemistries  Recent Labs  Lab 12/19/18 0507  NA 138  K 4.0  CL 110  CO2 20*  GLUCOSE 87  BUN 43*  CREATININE 1.58*  CALCIUM 8.5*    Cardiac Enzymes No results for input(s):  TROPONINI in the last 168 hours.  Microbiology Results  Results for orders placed or performed during the hospital encounter of 12/17/18  SARS Coronavirus 2 (CEPHEID - Performed in [facility] hospital lab), Hosp Order     Status: None   Collection Time: 12/17/18 11:49 PM   Specimen: Nasopharyngeal Swab  Result Value Ref Range Status   SARS Coronavirus 2 NEGATIVE NEGATIVE Final    Comment: (NOTE) If result is NEGATIVE SARS-CoV-2 target nucleic acids are NOT DETECTED. The SARS-CoV-2 RNA is generally detectable in upper and lower  respiratory specimens during the acute phase of infection. The lowest  concentration of SARS-CoV-2 viral copies this assay can detect is 250  copies / mL. A negative result does not preclude SARS-CoV-2 infection  and should not be used as  the sole basis for treatment or other  patient management decisions.  A negative result may occur with  improper specimen collection / handling, submission of specimen other  than nasopharyngeal swab, presence of viral mutation(s) within the  areas targeted by this assay, and inadequate number of viral copies  (<250 copies / mL). A negative result must be combined with clinical  observations, patient history, and epidemiological information. If result is POSITIVE SARS-CoV-2 target nucleic acids are DETECTED. The SARS-CoV-2 RNA is generally detectable in upper and lower  respiratory specimens dur ing the acute phase of infection.  Positive  results are indicative of active infection with SARS-CoV-2.  Clinical  correlation with patient history and other diagnostic information is  necessary to determine patient infection status.  Positive results do  not rule out bacterial infection or co-infection with other viruses. If result is PRESUMPTIVE POSTIVE SARS-CoV-2 nucleic acids MAY BE PRESENT.   A presumptive positive result was obtained on the submitted specimen  and confirmed on repeat testing.  While 2019 novel coronavirus   (SARS-CoV-2) nucleic acids may be present in the submitted sample  additional confirmatory testing may be necessary for epidemiological  and / or clinical management purposes  to differentiate between  SARS-CoV-2 and other Sarbecovirus currently known to infect humans.  If clinically indicated additional testing with an alternate test  methodology [phone]([id number]) is advised. The SARS-CoV-2 RNA is generally  detectable in upper and lower respiratory sp ecimens during the acute  phase of infection. The expected result is Negative. Fact Sheet for Patients:  BoilerBrush.com.cyhttps://www.fda.gov/media/136312/download Fact Sheet for Healthcare Providers: https://pope.com/https://www.fda.gov/media/136313/download This test is not yet approved or cleared by the [place] FDA and has been authorized for detection and/or diagnosis of SARS-CoV-2 by FDA under an Emergency Use Authorization (EUA).  This EUA will remain in effect (meaning this test can be used) for the duration of the COVID-19 declaration under Section 564(b)(1) of the Act, 21 U.S.C. section 360bbb-3(b)(1), unless the authorization is terminated or revoked sooner. Performed at [facility], [address] [place], [place] [address]   MRSA PCR Screening     Status: None   Collection Time: 12/18/18  3:02 AM   Specimen: Nasal Mucosa; Nasopharyngeal  Result Value Ref Range Status   MRSA by PCR NEGATIVE NEGATIVE Final    Comment:        The GeneXpert MRSA Assay (FDA approved for NASAL specimens only), is one component of a comprehensive MRSA colonization surveillance program. It is not intended to diagnose MRSA infection nor to guide or monitor treatment for MRSA infections. Performed at [facility], [address] [place], [place] [address]     RADIOLOGY:  Dg Chest 2 View  Result Date: 12/17/2018 CLINICAL DATA:  Peripheral edema EXAM: CHEST - 2 VIEW COMPARISON:  01/07/2018 FINDINGS: Cardiomegaly. No overt edema. No effusions. Linear  scarring or atelectasis in the lung bases. No acute bony abnormality. IMPRESSION: Cardiomegaly.  Bibasilar atelectasis or scarring. Electronically Signed   By: Charlett NoseKevin  Dover M.D.   On: 12/17/2018 19:11    Follow up with PCP in 1 week.  Management plans discussed with the patient, family and they are in agreement.  CODE STATUS:     Code Status Orders  (From admission, onward)         Start     Ordered   12/18/18 0142  Do not attempt resuscitation (DNR)  Continuous    Question Answer Comment  In the event of cardiac or respiratory ARREST  Do not call a "code blue"   In the event of cardiac or respiratory ARREST Do not perform Intubation, CPR, defibrillation or ACLS   In the event of cardiac or respiratory ARREST Use medication by any route, position, wound care, and other measures to relive pain and suffering. May use oxygen, suction and manual treatment of airway obstruction as needed for comfort.      12/18/18 0141        Code Status History    Date Active Date Inactive Code Status Order ID Comments User Context   06/12/2016 1254 06/16/2016 1901 DNR [id number]  Altamese DillingVachhani, Vaibhavkumar, MD Inpatient   06/12/2016 1112 06/12/2016 1254 Full Code [id number]  Kennedy BuckerMenz, Michael, MD ED   Advance Care Planning Activity    Advance Directive Documentation     Most Recent Value  Type of Advance Directive  Out of facility DNR (pink MOST or yellow form), Healthcare Power of Attorney  Pre-existing out of facility DNR order (yellow form or pink MOST form)  -  "MOST" Form in Place?  -      TOTAL TIME TAKING CARE OF THIS PATIENT ON DAY OF DISCHARGE: more than 30 minutes.   Molinda BailiffSrikar R Dillian Feig M.D on 12/19/2018 at 10:44 AM  Between 7am to 6pm - Pager - [phone]  After 6pm go to [email or web address] - password [email or web address]  [facility]  Office  [phone]  CC: Primary care physician; Dorothey BasemanBronstein, David, MD  Note: This dictation was prepared with Dragon dictation along with smaller phrase  technology. Any transcriptional errors that result from this process are unintentional.

## 2018-12-23 DIAGNOSIS — U071 COVID-19: Secondary | ICD-10-CM | POA: Diagnosis not present

## 2018-12-23 DIAGNOSIS — E119 Type 2 diabetes mellitus without complications: Secondary | ICD-10-CM | POA: Diagnosis not present

## 2018-12-25 IMAGING — DX DG CHEST 1V
1 series · 1 of 1 positions shown · non-contrast
Comparison: 06/12/2016 and older studies

CLINICAL DATA: Cough.

EXAM:
CHEST 1 VIEW

[chest ap]
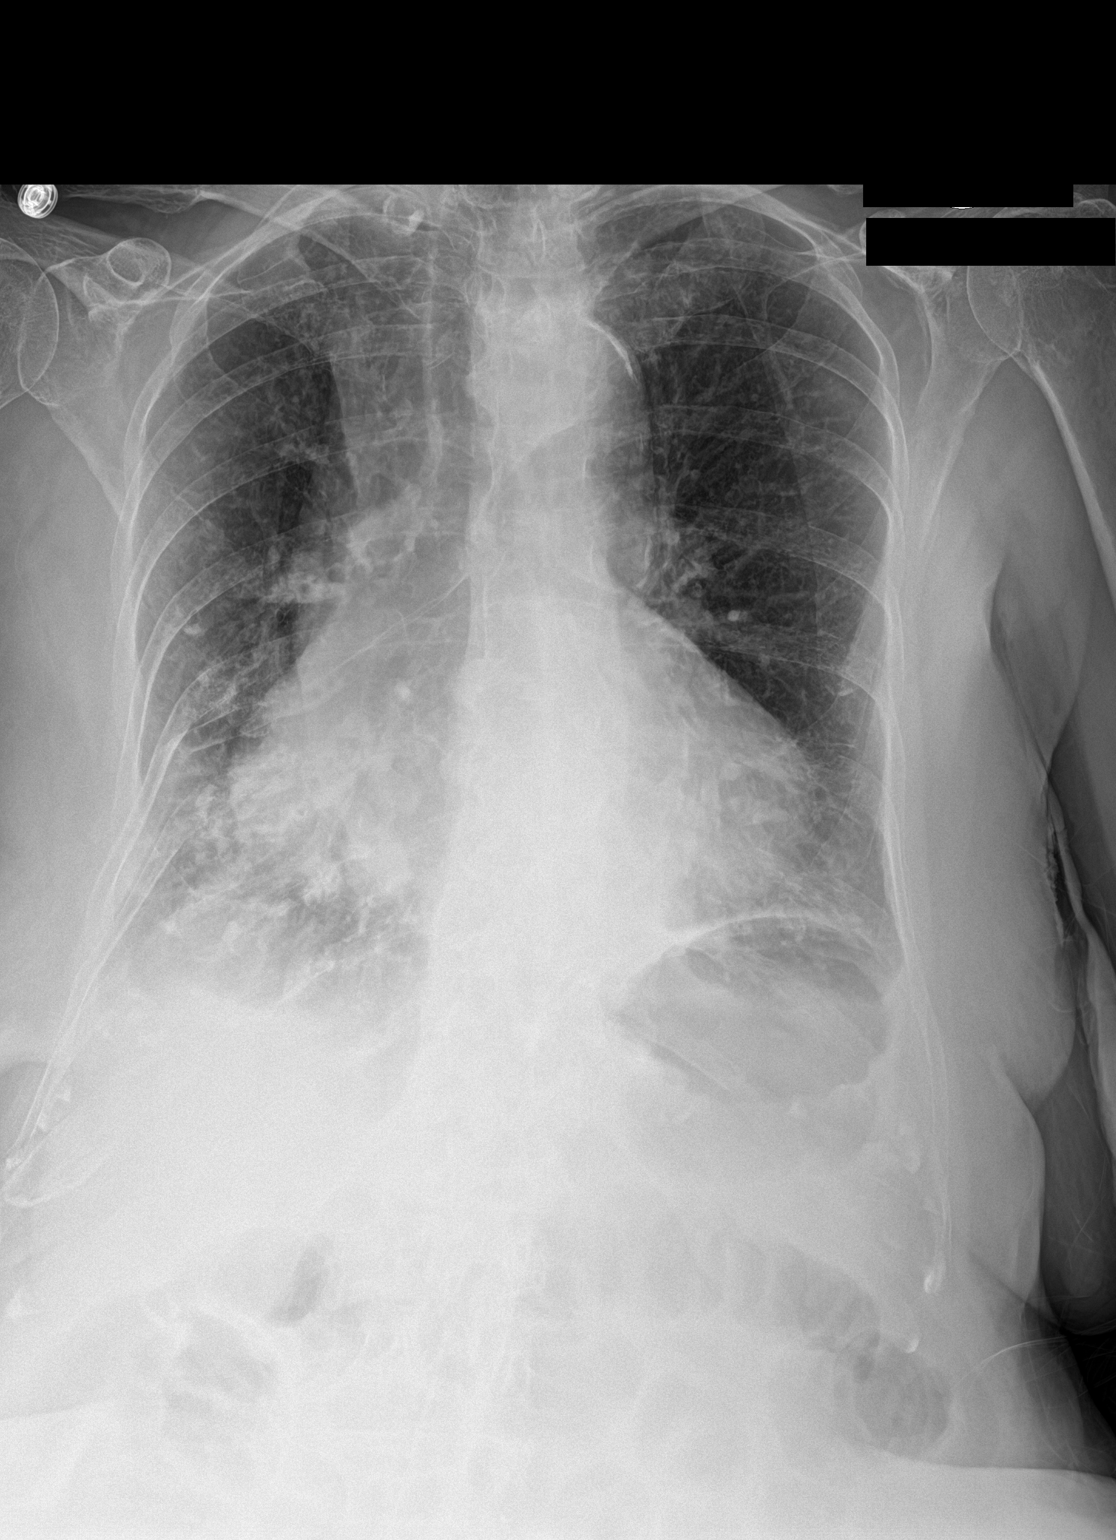

[1 of 1 positions shown; findings below may reference images not displayed]

FINDINGS: Moderate enlargement of the cardiopericardial silhouette. No
mediastinal or hilar masses. No convincing adenopathy.

Streaky opacity is noted in both lung bases. This is mildly
increased, particularly on the right, when compared to the prior
study. There is some mild hazy associated lung base opacity.
Findings may be due to a combination of chronic bronchitic change in
atelectasis. Pneumonia is possible. Remainder of the lungs is
essentially clear. No obvious pleural effusion. No pneumothorax.

Skeletal structures are demineralized but grossly intact.
IMPRESSION: 1. Lung base opacity, right greater than left, mildly increased when
compared to the prior study. This may reflect a combination of
chronic bronchitic change in atelectasis. A component of pneumonia
is suspected, particularly on the right, however, given the symptoms
of cough.

## 2018-12-26 ENCOUNTER — Other Ambulatory Visit: Payer: Self-pay

## 2018-12-26 ENCOUNTER — Non-Acute Institutional Stay: Payer: PPO | Admitting: Primary Care

## 2018-12-26 DIAGNOSIS — Z515 Encounter for palliative care: Secondary | ICD-10-CM

## 2018-12-26 NOTE — Progress Notes (Signed)
1`       [facility] Palliative Care Consult Note Telephone: [phone]  Fax: [phone]   PATIENT NAME: [name] DOB: 25-Jul-1923 MRN: [id number]  PRIMARY CARE PROVIDER:   Raelyn Number, MD  [facility]    REFERRING PROVIDER: RESPONSIBLE PARTY: Lawson Radar, NP [place],  [place] [address]     Extended Emergency Contact Information Primary Emergency Contact: Ward,Ginny Address: [address]          [place], [place] [address] Johnnette Litter of [place] Phone: [phone] Mobile Phone: [phone] Relation: Daughter Secondary Emergency Contact: Marshall Cork [place] of [place] Phone: [phone] Relation: Son  Palliative Care was asked to follow this patient by consultation request.Webster, Caryl Pina, NP. This is the initial visit.  ASSESSMENT AND RECOMMENDATIONS:   1. Goals of Care: Maximize quality of life and symptom management.  Patient went to ED 6/30 in AKI. Her diuretics have been adjusted. Caregivers state new edema with blistering and subsequent superficial wounds on bil LE. Edema persists. Patient has returned to ALF with nursing and therapy care needs: wound care, diuresing, strengthening.  2. Symptom Management:          Edema: This is fairly new and blisters have developed bil. Pt needs wound care with compression, likely foam, unna boot and light compression if patient does not have arterial disease. ALF reports dry weight of 160 lb and patient has ranged to 168- 162 lb, with daily weights and lasix when indicated. D/c summary states to continue her HCTZ unless creatine worsens, labs on 7/6 show unchanged creatinine per report of ALF staff. Continue the HCTZ since creatinine was unchanged. May need more aggressive fluid management if compression does not reduce edema.  It appears fluid load is still being addressed with lasix 20 mg daily prn > 3 pounds of weight but clinically she has 3+  pitting edema in bil LE. Recent albumin = 3.0; dietary supplements may be considered if renal function permits.   Debility: ALF states patient has had significant change in ability for adls, could get up and dress herself and go to meal table. Now needs assistance to get up and has fallen multiple times. I recommend SNF admission be revisited as the safest and most efficient way for Mrs. Onder to receive skilled rehab services, wound care, and medication titration before return to ALF.  ALF staff to speak with SW about placement, and discuss with family.  Change in mental status: Could be from myriad sources, and delirium is a real risk; daughter was concerned that trazodone was increase from 50 mg nightly to 150 mg at hospital. Recommend and order given to return to 50 mg trazodone nightly and titrate more slowly if insomnia persists.   3. Family /Caregiver/Community Supports: Recommend transfer to SNF for nursing care of LE wounds, OT and PT assessment and treatment plan, and adjustment of diuretics with electrolyte monitoring, for short term before returning to ALF. Discussed with daughter Donia Guiles. Lives in ALF currently, has had a recent decline due to acute kidney injury, was in hospital 2 days, has returned to ALF but at a lower functional level.  4. Cognitive / Functional decline: Has had periods of increased confusion and weakness s/p hospital stay. Now in quarantine at ALF due to covid precautions.Has not been able to ambulate outside room and is losing mobility. Recommend SNF placement for short term rehab and when returned to ALF, provide some diversions during  quarantine such as bird feeders at the window, television in the room, scheduled activities, etc to ease the isolation. ° °5. Advanced Care Directive: DNR, uploaded to [facility]. ° °6. Follow up Palliative Care Visit: Palliative care will continue to follow for goals of care clarification and symptom management. Return 1-2 weeks or prn. ° °I spent  110 minutes providing this consultation,  from 1130 to 1320. More than 50% of the time in this consultation was spent coordinating communication.  ° °HISTORY OF PRESENT ILLNESS:  [name] is a 85 y.o. year old female with multiple medical problems including recent AKI, peripheral edema,  LE blisters, now open wounds,HTN, macular degeneration, dementia,. Palliative Care was asked to help address goals of care.  ° °CODE STATUS: DNR ° °PPS: 30% °HOSPICE ELIGIBILITY/DIAGNOSIS: TBD ° °PAST MEDICAL HISTORY:  °Past Medical History:  °Diagnosis Date  °• Dementia (HCC)   °• GERD (gastroesophageal reflux disease)   °• Hypertension   °• Macular degeneration   °  °SOCIAL HX:  °Social History  ° °Tobacco Use  °• Smoking status: Never Smoker  °• Smokeless tobacco: Never Used  °Substance Use Topics  °• Alcohol use: Never  °  Frequency: Never  ° ° °ALLERGIES:  °Allergies  °Allergen Reactions  °• Atorvastatin Other (See Comments)  °  Muscle pain   °   °PERTINENT MEDICATIONS:  °Outpatient Encounter Medications as of 12/26/2018  °Medication Sig  °• aspirin EC 81 MG tablet Take 81 mg by mouth daily.  °• calcium carbonate (TUMS EX) 750 MG chewable tablet Chew 1 tablet by mouth every evening. chew and swallow  °• Calcium Carbonate Antacid (TUMS PO) Take 1 tablet by mouth 2 (two) times daily as needed (chew and swallow antiacid).   °• carboxymethylcellul-glycerin (OPTIVE) 0.5-0.9 % ophthalmic solution Place 1 drop into both eyes 2 (two) times daily.  °• docusate sodium (COLACE) 100 MG capsule Take 1 capsule (100 mg total) by mouth 2 (two) times daily. (Patient taking differently: Take 100 mg by mouth daily. )  °• donepezil (ARICEPT) 10 MG tablet Take 10 mg by mouth at bedtime.  °• furosemide (LASIX) 20 MG tablet Take 1 tablet (20 mg total) by mouth daily as needed. Daily As needed if > 3 lb weight gain  °• glycerin adult 2 g suppository Place 1 suppository rectally as needed for constipation.  °• hydrochlorothiazide (HYDRODIURIL) 12.5  MG tablet Take 12.5 mg by mouth daily.  °• Lidocaine HCl (ASPERCREME LIDOCAINE) 4 % LIQD Apply 1 application topically 2 (two) times daily as needed. to legs  °• LORazepam (ATIVAN) 0.5 MG tablet Take 0.5 mg by mouth every 8 (eight) hours as needed for anxiety.  °• metoprolol tartrate (LOPRESSOR) 50 MG tablet   °• Multiple Vitamins-Minerals (OCUVITE ADULT 50+) CAPS Take 1 capsule by mouth daily.  °• omeprazole (PRILOSEC) 20 MG capsule Take 20 mg by mouth daily.  °• traMADol (ULTRAM) 50 MG tablet Take 1 tablet (50 mg total) by mouth 2 (two) times a day.  °• traZODone (DESYREL) 150 MG tablet Take 1 tablet (150 mg total) by mouth at bedtime.  °• triamcinolone cream (KENALOG) 0.1 % Apply 1 application topically 2 (two) times daily as needed. Under the breast an on the abdominal area  °• vitamin B-12 (CYANOCOBALAMIN) 1000 MCG tablet Take 1 tablet by mouth daily.  ° °No facility-administered encounter medications on file as of 12/26/2018.   ° ° °PHYSICAL EXAM/ROS:  ° °Current and past weights: 162 to 168 to 166, Dry   wt 158-160 lb.  °General: NAD, frail, WNWD, eating 60% all meals °Cardiovascular: no chest pain reported, 3+ edema, S1S2 °Pulmonary: no cough, no increased SOB, clear anterior lobes, mild rales bil bases. °Abdomen: appetite fair, endorses constipation, continent of bowel per pt report °GU: denies dysuria, incontinent of urine, increased output r/t diuresis. °MSK:  Weakness, needs stand by assistance and assistive device, frequent falls °Skin: edematous LE, several superficial wounds in LE d/t blisters from edema, wrapped. °Neurological: Weakness, family reports some change in mental status, possibly delirium. Mild insomnia ° ° M  DNP, MPH,  AGPCNP-BC ° ° °

## 2018-12-27 DIAGNOSIS — F0390 Unspecified dementia without behavioral disturbance: Secondary | ICD-10-CM | POA: Diagnosis not present

## 2018-12-27 DIAGNOSIS — Z4800 Encounter for change or removal of nonsurgical wound dressing: Secondary | ICD-10-CM | POA: Diagnosis not present

## 2018-12-27 DIAGNOSIS — R238 Other skin changes: Secondary | ICD-10-CM | POA: Diagnosis not present

## 2018-12-27 DIAGNOSIS — Z9181 History of falling: Secondary | ICD-10-CM | POA: Diagnosis not present

## 2018-12-27 DIAGNOSIS — I5032 Chronic diastolic (congestive) heart failure: Secondary | ICD-10-CM | POA: Diagnosis not present

## 2018-12-27 DIAGNOSIS — Z96642 Presence of left artificial hip joint: Secondary | ICD-10-CM | POA: Diagnosis not present

## 2018-12-27 DIAGNOSIS — G8929 Other chronic pain: Secondary | ICD-10-CM | POA: Diagnosis not present

## 2018-12-27 DIAGNOSIS — Z7982 Long term (current) use of aspirin: Secondary | ICD-10-CM | POA: Diagnosis not present

## 2018-12-27 DIAGNOSIS — I110 Hypertensive heart disease with heart failure: Secondary | ICD-10-CM | POA: Diagnosis not present

## 2018-12-27 DIAGNOSIS — Z79891 Long term (current) use of opiate analgesic: Secondary | ICD-10-CM | POA: Diagnosis not present

## 2018-12-27 DIAGNOSIS — K219 Gastro-esophageal reflux disease without esophagitis: Secondary | ICD-10-CM | POA: Diagnosis not present

## 2018-12-27 DIAGNOSIS — F329 Major depressive disorder, single episode, unspecified: Secondary | ICD-10-CM | POA: Diagnosis not present

## 2018-12-27 DIAGNOSIS — N179 Acute kidney failure, unspecified: Secondary | ICD-10-CM | POA: Diagnosis not present

## 2018-12-27 DIAGNOSIS — H3530 Unspecified macular degeneration: Secondary | ICD-10-CM | POA: Diagnosis not present

## 2018-12-30 DIAGNOSIS — S81801A Unspecified open wound, right lower leg, initial encounter: Secondary | ICD-10-CM | POA: Diagnosis not present

## 2018-12-30 DIAGNOSIS — S81802A Unspecified open wound, left lower leg, initial encounter: Secondary | ICD-10-CM | POA: Diagnosis not present

## 2019-01-02 ENCOUNTER — Non-Acute Institutional Stay: Payer: PPO | Admitting: Primary Care

## 2019-01-02 ENCOUNTER — Encounter: Payer: Self-pay | Admitting: Emergency Medicine

## 2019-01-02 ENCOUNTER — Other Ambulatory Visit: Payer: Self-pay

## 2019-01-02 ENCOUNTER — Emergency Department
Admission: EM | Admit: 2019-01-02 | Discharge: 2019-01-02 | Disposition: A | Discharge status: Home / Self Care | Payer: PPO | Attending: Emergency Medicine | Admitting: Emergency Medicine

## 2019-01-02 DIAGNOSIS — F0390 Unspecified dementia without behavioral disturbance: Secondary | ICD-10-CM | POA: Insufficient documentation

## 2019-01-02 DIAGNOSIS — R601 Generalized edema: Secondary | ICD-10-CM

## 2019-01-02 DIAGNOSIS — R531 Weakness: Secondary | ICD-10-CM | POA: Diagnosis not present

## 2019-01-02 DIAGNOSIS — Z743 Need for continuous supervision: Secondary | ICD-10-CM | POA: Diagnosis not present

## 2019-01-02 DIAGNOSIS — R609 Edema, unspecified: Secondary | ICD-10-CM | POA: Diagnosis not present

## 2019-01-02 DIAGNOSIS — Z515 Encounter for palliative care: Secondary | ICD-10-CM | POA: Diagnosis not present

## 2019-01-02 DIAGNOSIS — I10 Essential (primary) hypertension: Secondary | ICD-10-CM | POA: Insufficient documentation

## 2019-01-02 DIAGNOSIS — Z96642 Presence of left artificial hip joint: Secondary | ICD-10-CM | POA: Insufficient documentation

## 2019-01-02 DIAGNOSIS — R279 Unspecified lack of coordination: Secondary | ICD-10-CM | POA: Diagnosis not present

## 2019-01-02 LAB — COMPREHENSIVE METABOLIC PANEL
ALT: 11 U/L (ref 0–44)
AST: 13 U/L — ABNORMAL LOW (ref 15–41)
Albumin: 3 g/dL — ABNORMAL LOW (ref 3.5–5.0)
Alkaline Phosphatase: 90 U/L (ref 38–126)
Anion gap: 9 (ref 5–15)
BUN: 13 mg/dL (ref 8–23)
CO2: 33 mmol/L — ABNORMAL HIGH (ref 22–32)
Calcium: 9.2 mg/dL (ref 8.9–10.3)
Chloride: 88 mmol/L — ABNORMAL LOW (ref 98–111)
Creatinine, Ser: 0.71 mg/dL (ref 0.44–1.00)
GFR calc Af Amer: >60 mL/min (ref >60)
GFR calc non Af Amer: >60 mL/min (ref >60)
Glucose, Bld: 92 mg/dL (ref 70–99)
Potassium: 3.8 mmol/L (ref 3.5–5.1)
Sodium: 130 mmol/L — ABNORMAL LOW (ref 135–145)
Total Bilirubin: 0.7 mg/dL (ref 0.3–1.2)
Total Protein: 5.9 g/dL — ABNORMAL LOW (ref 6.5–8.1)

## 2019-01-02 LAB — CBC WITH DIFFERENTIAL/PLATELET
Abs Immature Granulocytes: 0.02 10*3/uL (ref 0.00–0.07)
Basophils Absolute: 0 10*3/uL (ref 0.0–0.1)
Basophils Relative: 0 %
Eosinophils Absolute: 0.2 10*3/uL (ref 0.0–0.5)
Eosinophils Relative: 3 %
HCT: 34.4 % — ABNORMAL LOW (ref 36.0–46.0)
Hemoglobin: 10.7 g/dL — ABNORMAL LOW (ref 12.0–15.0)
Immature Granulocytes: 0 %
Lymphocytes Relative: 13 %
Lymphs Abs: 1.2 10*3/uL (ref 0.7–4.0)
MCH: 27.2 pg (ref 26.0–34.0)
MCHC: 31.1 g/dL (ref 30.0–36.0)
MCV: 87.5 fL (ref 80.0–100.0)
Monocytes Absolute: 0.8 10*3/uL (ref 0.1–1.0)
Monocytes Relative: 9 %
Neutro Abs: 6.7 10*3/uL (ref 1.7–7.7)
Neutrophils Relative %: 75 %
Platelets: 244 10*3/uL (ref 150–400)
RBC: 3.93 MIL/uL (ref 3.87–5.11)
RDW: 14.7 % (ref 11.5–15.5)
WBC: 8.9 10*3/uL (ref 4.0–10.5)
nRBC: 0 % (ref 0.0–0.2)

## 2019-01-02 NOTE — Care Management (Addendum)
RNCM called to room by daughter Donia Guiles [phone] which is a retired Special educational needs teacher.  Donia Guiles states that since patient was in the hospital, her mother/patient's mental status and mobility has become worse and [facility] can no longer meet her total care needs.  Donia Guiles hopes that patient can receive end-of-life care at [facility].  Ginny shared that she would be willing to take patient home with hospice however her apartment [address] [place] [address] will need to be re-arranged and she will need time to do this.  Patient is currently open at [facility] with [facility] is aware.  TOC to follow. Update at 1400: Santiago Glad with hospice is working currently with patient. Per Donia Guiles, patient will need a hospital bed and bedside commode. Santiago Glad is aware and assisting patient with that. EDP updated. Update at 1430: Patient has been accepted for hospice services at home. DME pending delivery to Ginny's home and then EMS can be called. ED RN updated.

## 2019-01-02 NOTE — ED Triage Notes (Signed)
Pt from [facility] with c/o bilateral lower extremity swelling and decreased ambulation. PT hx of demetia, on palliative care

## 2019-01-02 NOTE — ED Notes (Signed)
Verbal consent from family for pt to d/c via EMS home to palliative care.

## 2019-01-02 NOTE — ED Notes (Signed)
PT daughter at bedside

## 2019-01-02 NOTE — Progress Notes (Signed)
ED visit made. Patient is currently followed at [facility] by outpatient Palliative, she came to the ED today for evaluation of continued functional decline, increased edema and confusion. Writer was notified by [facility] Marshell Garfinkel of patient's daughter's request for hospice services at home. Writer met in the room with patient's daughter Melanie Crazier to discuss hospice services and DME needs. Plan is for patient to discharge to [facility] home (address correct on hospital faxed sheet). DME to be delivered today by 5 pm. Patient seen lying on the ED stretcher, difficult to rouse with just verbal stimuli, shew was able to say she was in the hospital, but then asked if there were "dogs at the end of " her bed.  Patient will require EMS transport at discharge. Hospital care team and Ginny all aware and in agreement with current discharge plan. Patient information faxed to referral. Thank you. Flo Shanks BSN, RN, [facility] [phone]

## 2019-01-02 NOTE — Care Management (Signed)
DME has been delivered to daughter's home and daughter is ready for patient to discharge. EDP/RN updated.

## 2019-01-02 NOTE — ED Notes (Signed)
Pt given more warm blankets; position in bed adjusted; pillow adjusted. Pt given food tray and drink. Family confirmed for this RN earlier that pt is on regular diet at home.

## 2019-01-02 NOTE — ED Provider Notes (Signed)
Patient will go home with home hospice set up.   Earleen Newport, MD 01/02/19 [phone]

## 2019-01-02 NOTE — Progress Notes (Signed)
[facility] Consult Note Telephone: [phone]  Fax: [phone]  TELEHEALTH VISIT STATEMENT Due to the COVID-19 crisis, this visit was done via telemedicine from my office. It was initiated and consented to by this patient and/or family.  PATIENT NAME: [name] DOB: 1923-08-22 MRN: [id number]  PRIMARY CARE PROVIDER:   Clelia CroftGeorge, Robert, MD [phone]  REFERRING PROVIDER:  Clelia CroftGeorge, Robert, MD [address] [place] [address] [phone]  RESPONSIBLE PARTY:   Extended Emergency Contact Information Primary Emergency Contact: Ward,Ginny Address: [address]          [place], [place] [address] [place] Home Phone: [phone] Mobile Phone: [phone] Relation: Daughter Secondary Emergency Contact: Nyoka LintBranson,Kowalczyk  [place] Home Phone: [phone] Relation: Son  Palliative Care was asked to follow this patient by consultation request of Clelia CroftGeorge, Robert, MD. This is a follow up visit.  ASSESSMENT AND RECOMMENDATIONS:   1. Goals of Care: Maximize quality of life and symptom management.  2. Symptom Management:   Wound management: Home health has started and now has unna boot and compression. This is [facility].  Debility: Continues to need higher level of care per ALF and family report. Daughter states she wants to see if she can rehab, then will bring her to her own home.  Edema: Staff reports subjectively worse. State labs are ordered. Daily weights are not possible due to inability to stand.  Mental Status: Continues to be confused and states she needed to get back to her apartment. She called a friend to let her in her apartment but lives in ALF. Would need to have an assessment of blood work to know more.   3. Family /Caregiver/Community Supports: Currently in ALF but beyond this level of care. Family states they want her home to pass away but  want to try short term rehab first. She later  states she wants to see if hospice PT can help. Will clarify services once patient has been reassessed. Family wants her to go to ED for assessment.  4. Cognitive / Functional decline: Increased confusion and loss of adl ability.  Decline of function is  Significant over past several weeks.  5. Advanced Care Directive:  DNR, wish hospice admission at daughter's home if appropriate after ED assessment.  6. Follow up Palliative Care Visit: Palliative care will continue to follow for goals of care clarification and symptom management. Patient appropriate for hospice referral.  I spent 40  minutes providing this consultation,  from 0900 to 0940. More than 50% of the time in this consultation was spent coordinating communication.   HISTORY OF PRESENT ILLNESS:  Maria Barterolly G Matthies is a 85 y.o. year old female with multiple medical problems including recent AKI, peripheral edema,  LE blisters, now open wounds,HTN, macular degeneration, dementia. Palliative Care was asked to help address goals of care.   CODE STATUS: DNR  PPS: 30% HOSPICE ELIGIBILITY/DIAGNOSIS: yes/AKI  PAST MEDICAL HISTORY:  Past Medical History:  Diagnosis Date  . Dementia (HCC)   . GERD (gastroesophageal reflux disease)   . Hypertension   . Macular degeneration     SOCIAL HX:  Social History   Tobacco Use  . Smoking status: Never Smoker  . Smokeless tobacco: Never Used  Substance Use Topics  . Alcohol use: Never    Frequency: Never    ALLERGIES:  Allergies  Allergen Reactions  . Atorvastatin Other (See Comments)  Muscle pain      PERTINENT MEDICATIONS:  Outpatient Encounter Medications as of 01/02/2019  Medication Sig  . aspirin EC 81 MG tablet Take 81 mg by mouth daily.  . calcium carbonate (TUMS EX) 750 MG chewable tablet Chew 1 tablet by mouth every evening. chew and swallow  . Calcium Carbonate Antacid (TUMS PO) Take 1 tablet by mouth 2 (two) times daily as needed  (chew and swallow antiacid).   . carboxymethylcellul-glycerin (OPTIVE) 0.5-0.9 % ophthalmic solution Place 1 drop into both eyes 2 (two) times daily.  Marland Kitchen docusate sodium (COLACE) 100 MG capsule Take 1 capsule (100 mg total) by mouth 2 (two) times daily. (Patient taking differently: Take 100 mg by mouth daily. )  . donepezil (ARICEPT) 10 MG tablet Take 10 mg by mouth at bedtime.  . furosemide (LASIX) 20 MG tablet Take 1 tablet (20 mg total) by mouth daily as needed. Daily As needed if > 3 lb weight gain  . glycerin adult 2 g suppository Place 1 suppository rectally as needed for constipation.  . hydrochlorothiazide (HYDRODIURIL) 12.5 MG tablet Take 12.5 mg by mouth daily.  . Lidocaine HCl (ASPERCREME LIDOCAINE) 4 % LIQD Apply 1 application topically 2 (two) times daily as needed. to legs  . LORazepam (ATIVAN) 0.5 MG tablet Take 0.5 mg by mouth every 8 (eight) hours as needed for anxiety.  . metoprolol tartrate (LOPRESSOR) 50 MG tablet   . Multiple Vitamins-Minerals (OCUVITE ADULT 50+) CAPS Take 1 capsule by mouth daily.  Marland Kitchen omeprazole (PRILOSEC) 20 MG capsule Take 20 mg by mouth daily.  . traMADol (ULTRAM) 50 MG tablet Take 1 tablet (50 mg total) by mouth 2 (two) times a day.  . traZODone (DESYREL) 150 MG tablet Take 1 tablet (150 mg total) by mouth at bedtime.  . triamcinolone cream (KENALOG) 0.1 % Apply 1 application topically 2 (two) times daily as needed. Under the breast an on the abdominal area  . vitamin B-12 (CYANOCOBALAMIN) 1000 MCG tablet Take 1 tablet by mouth daily.   No facility-administered encounter medications on file as of 01/02/2019.     PHYSICAL EXAM/ROS:   Current and past weights: unavailable General: NAD, frail  Cardiovascular: no chest pain reported, increased 3-4+edema per staff,  Pulmonary: no cough, no increased SOB Abdomen: appetite labile,  incontinent of bowel GU: denies dysuria, incontinent of urine MSK:  Non ambulatory Skin: wounds on LE due to edema, unna in  place Neurological: Weakness, increased confusion  Cyndia Skeeters DNP AGPCNP-BC

## 2019-01-02 NOTE — ED Notes (Signed)
Pt assisted to answer personal phone as family had been trying to call pt. Phone plugged in to wall as it had low charge.

## 2019-01-02 NOTE — ED Provider Notes (Signed)
[facility] Emergency Department Provider Note       Time seen: ----------------------------------------- 11:10 AM on 01/02/2019 -----------------------------------------   I have reviewed the triage vital signs and the nursing notes.  HISTORY   Chief Complaint No chief complaint on file.    HPI [name] is a 85 y.o. female with a history of dementia, GERD, hypertension who presents to the ED for peripheral edema.  Reportedly she has had edema for some time, is currently on palliative care.  She is sent from [facility] assisted living for swelling.  Patient denies any complaints at this time with a history of dementia.  Past Medical History:  Diagnosis Date  . Dementia ([place])   . GERD (gastroesophageal reflux disease)   . Hypertension   . Macular degeneration     Patient Active Problem List   Diagnosis Date Noted  . AKI (acute kidney injury) ([place]) 12/17/2018  . HTN (hypertension) 12/17/2018  . GERD (gastroesophageal reflux disease) 12/17/2018  . Dementia ([place]) 12/17/2018  . Peripheral edema 12/17/2018  . Hip fracture ([place]) 06/12/2016    Past Surgical History:  Procedure Laterality Date  . ABDOMINAL HYSTERECTOMY    . COLONOSCOPY    . TOTAL HIP ARTHROPLASTY    . TOTAL HIP ARTHROPLASTY Left 06/12/2016   Procedure: left hip hemiarthroplasty anterior approach;  Surgeon: Hessie Knows, MD;  Location: [facility] ORS;  Service: Orthopedics;  Laterality: Left;    Allergies Atorvastatin  Social History Social History   Tobacco Use  . Smoking status: Never Smoker  . Smokeless tobacco: Never Used  Substance Use Topics  . Alcohol use: Never    Frequency: Never  . Drug use: Never    Review of Systems Constitutional: Negative for fever. Cardiovascular: Negative for chest pain. Respiratory: Negative for shortness of breath. Gastrointestinal: Negative for abdominal pain, vomiting and diarrhea. Musculoskeletal: Positive for peripheral edema Skin:  Negative for rash. Neurological: Negative for headaches, focal weakness or numbness.  All systems negative/normal/unremarkable except as stated in the HPI  ____________________________________________   PHYSICAL EXAM:  VITAL SIGNS: ED Triage Vitals  Enc Vitals Group     BP      Pulse      Resp      Temp      Temp src      SpO2      Weight      Height      Head Circumference      Peak Flow      Pain Score      Pain Loc      Pain Edu?      Excl. in [place]?     Constitutional: Alert but disoriented.  Well appearing and in no distress. Eyes: Conjunctivae are normal. Normal extraocular movements. Cardiovascular: Normal rate, regular rhythm. No murmurs, rubs, or gallops. Respiratory: Normal respiratory effort without tachypnea nor retractions. Breath sounds are clear and equal bilaterally. No wheezes/rales/rhonchi. Gastrointestinal: Soft and nontender. Normal bowel sounds Musculoskeletal: Bilateral peripheral edema is noted with Unna boot dressings in place Neurologic:  Normal speech and language. No gross focal neurologic deficits are appreciated.  Skin:  Skin is warm, dry and intact.  Pallor is noted Psychiatric: Mood and affect are normal.  ____________________________________________  EKG: Interpreted by me.  Atrial fibrillation with a rate of 76 bpm, wide QRS, nonspecific ST segment changes, normal QT  ____________________________________________  ED COURSE:  As part of my medical decision making, I reviewed the following data within the [place]  History obtained from family if available, nursing notes, old chart and ekg, as well as notes from prior ED visits. Patient presented for peripheral edema, we will assess with labs and imaging as indicated at this time.   Procedures  [name] was evaluated in Emergency Department on 01/02/2019 for the symptoms described in the history of present illness. She was evaluated in the context of the global  COVID-19 pandemic, which necessitated consideration that the patient might be at risk for infection with the SARS-CoV-2 virus that causes COVID-19. Institutional protocols and algorithms that pertain to the evaluation of patients at risk for COVID-19 are in a state of rapid change based on information released by regulatory bodies including the CDC and federal and state organizations. These policies and algorithms were followed during the patient's care in the ED.  ____________________________________________   LABS (pertinent positives/negatives)  Labs Reviewed  CBC WITH DIFFERENTIAL/PLATELET - Abnormal; Notable for the following components:      Result Value   Hemoglobin 10.7 (*)    HCT 34.4 (*)    All other components within normal limits  COMPREHENSIVE METABOLIC PANEL - Abnormal; Notable for the following components:   Sodium 130 (*)    Chloride 88 (*)    CO2 33 (*)    Total Protein 5.9 (*)    Albumin 3.0 (*)    AST 13 (*)    All other components within normal limits   ____________________________________________   DIFFERENTIAL DIAGNOSIS   Peripheral edema, venous stasis, CHF, renal failure, occult infection, dehydration  FINAL ASSESSMENT AND PLAN  Peripheral edema, hospice care   Plan: The patient had presented for peripheral edema. Patient's labs were surprisingly normal.  Family states that the patient feels she is at end-of-life.  Family is asking for hospice care.  We will attempt to arrange to the hospice home.   Ulice DashJohnathan E Williams, MD    Note: This note was generated in part or whole with voice recognition software. Voice recognition is usually quite accurate but there are transcription errors that can and very often do occur. I apologize for any typographical errors that were not detected and corrected.     Emily FilbertWilliams, Jonathan E, MD 01/02/19 1218

## 2019-01-02 NOTE — ED Notes (Addendum)
Zack NT placed some briefs in a bag to send with pt. Pt assisted to use bedpan. Pt urinated enough to leak outside of bedpan. Peri care provided. Linens changed.

## 2019-01-02 NOTE — ED Notes (Signed)
Pt dry. This RN and 2nd RN attempted to have pt use bedpan. Pt did not urinate or have BM. Bed pads rearranged. Pt moved up in bed.

## 2019-02-18 ENCOUNTER — Telehealth: Payer: Self-pay | Admitting: Primary Care

## 2019-02-18 NOTE — Telephone Encounter (Signed)
Patient passed away on Feb 13, 2019 on hospice services.

## 2019-02-18 DEATH — deceased

## 2019-07-17 IMAGING — US US ABDOMEN LIMITED
1 series · 14 of 25 positions shown · non-contrast
Comparison: Abdominal and pelvic CT scan July 01, 2004.

CLINICAL DATA: Epigastric pain

EXAM:
ULTRASOUND ABDOMEN LIMITED RIGHT UPPER QUADRANT

[Series 1: us abdomen limited · 0.17mm/px · 14 of 63 slices shown]
[im 1/63]
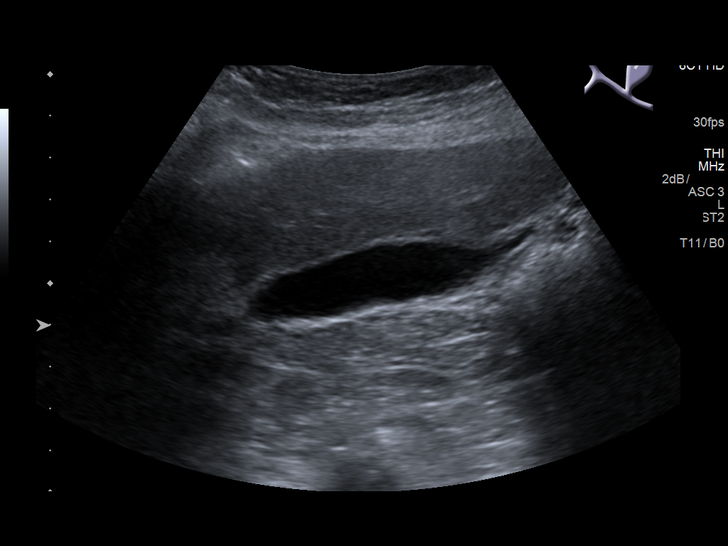
[im 6/63]
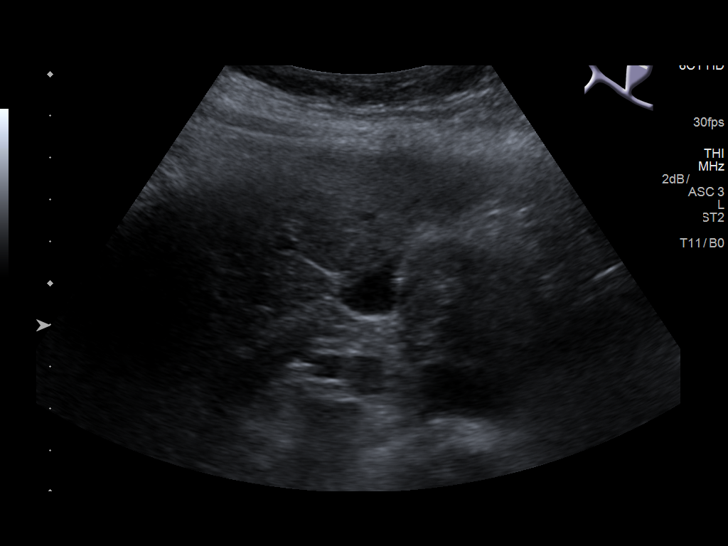
[im 11/63]
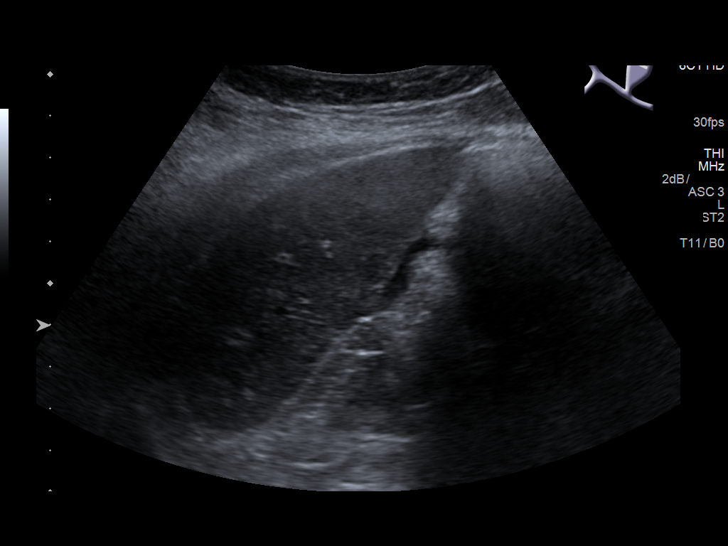
[im 16/63]
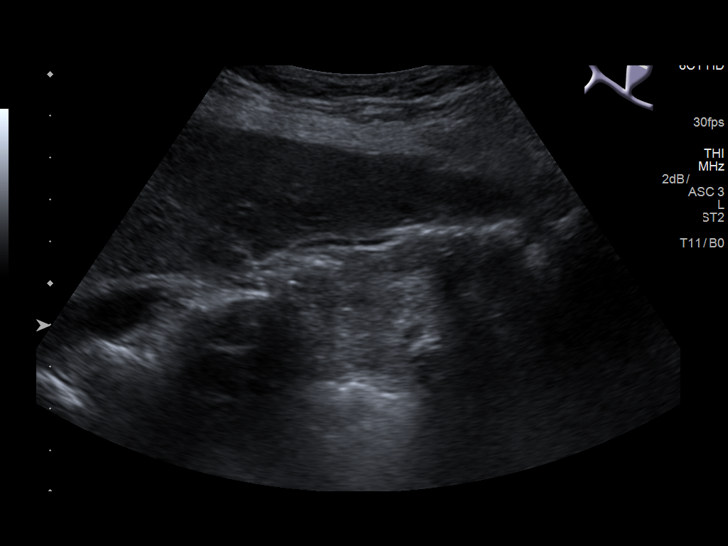
[im 21/63]
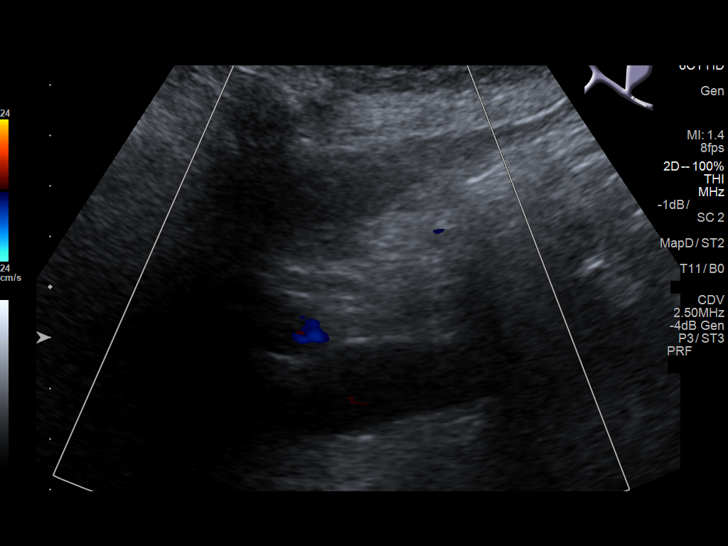
[im 24/63]
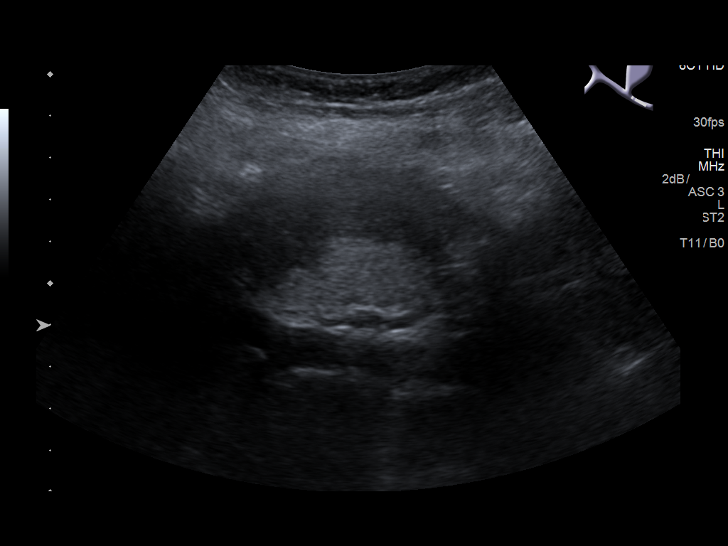
[im 29/63]
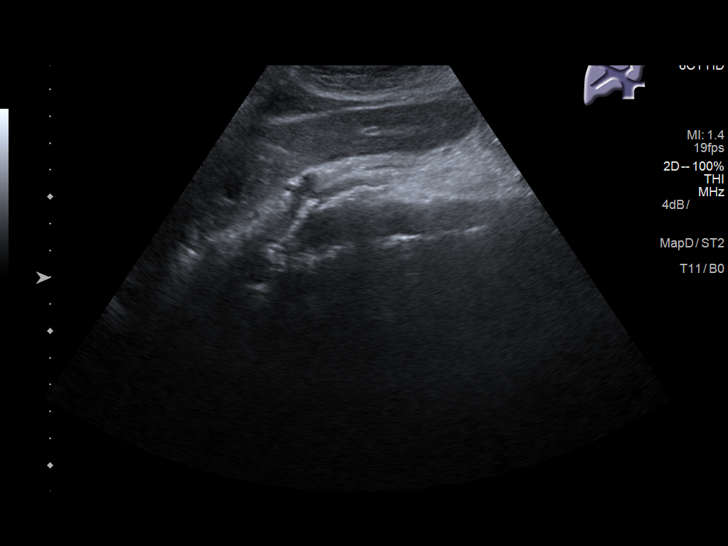
[im 34/63]
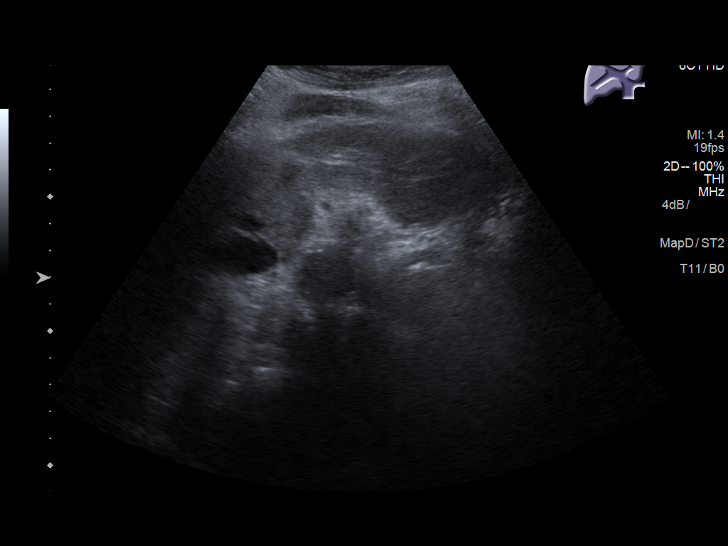
[im 39/63]
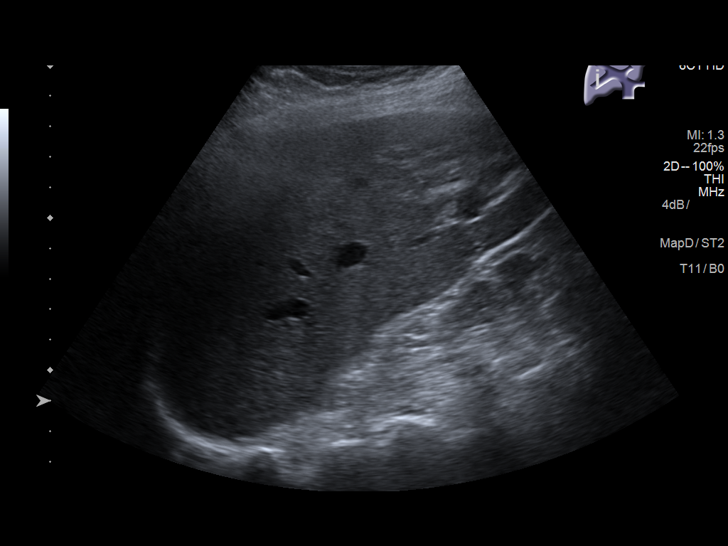
[im 42/63]
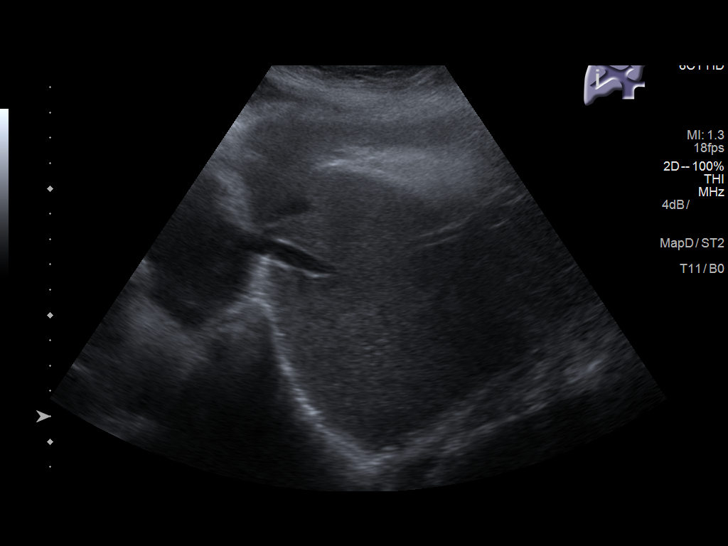
[im 47/63]
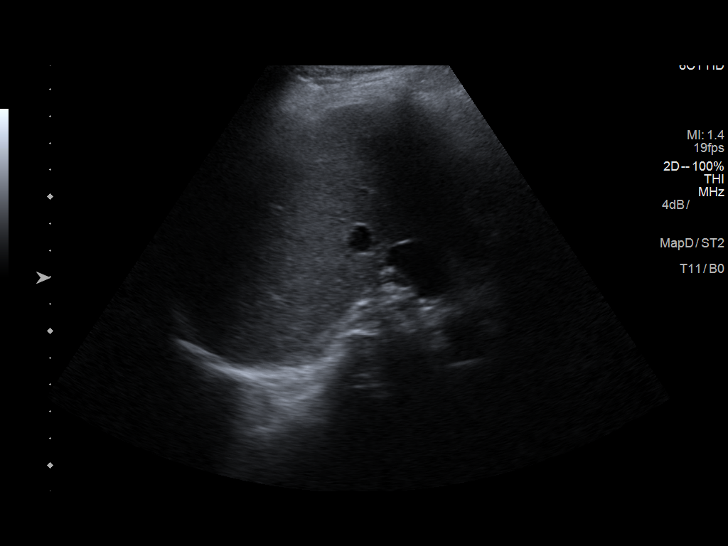
[im 52/63]
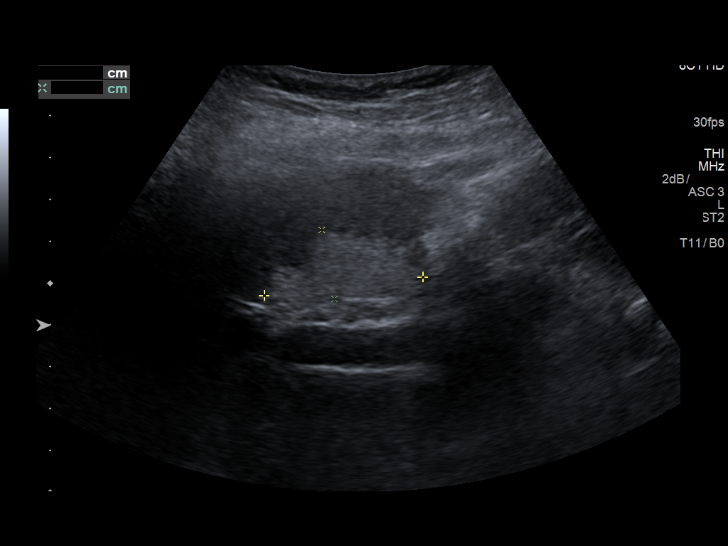
[im 57/63]
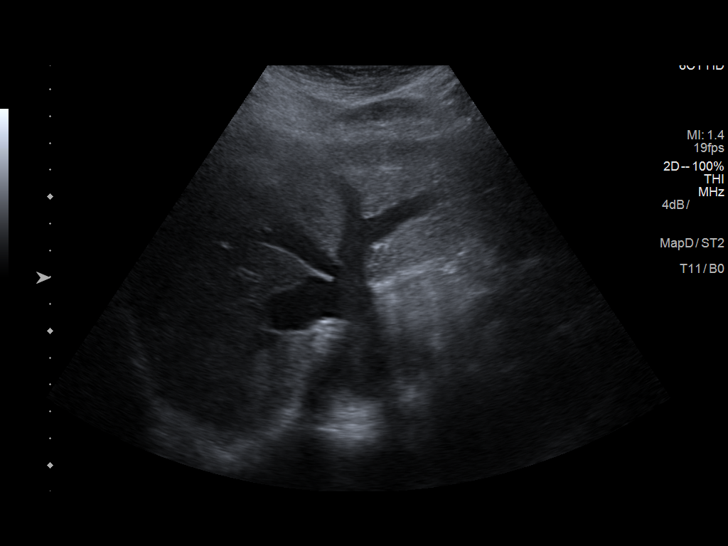
[im 63/63]
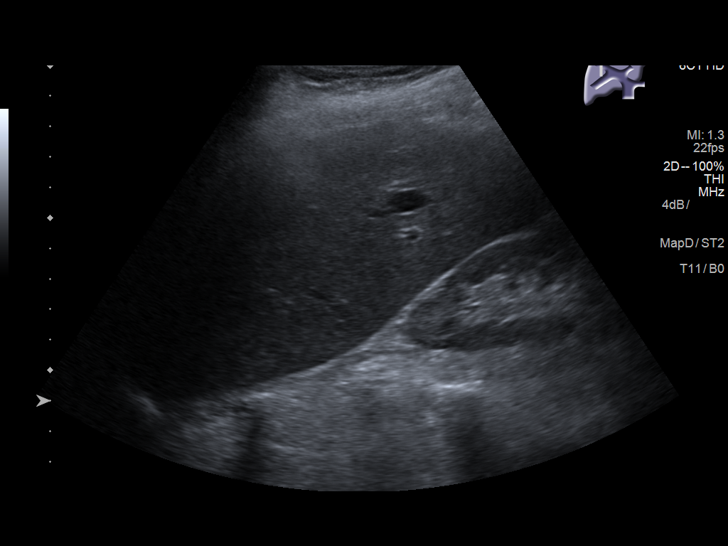

[14 of 25 positions shown; findings below may reference images not displayed]

FINDINGS: Gallbladder:

No gallstones or wall thickening visualized. No sonographic Murphy
sign noted by sonographer.

Common bile duct:

Diameter: 3.8 mm

Liver:

The hepatic echotexture is normal. In the right hepatic lobe there
is a hyperechoic region measuring 3.8 x 1.7 x 3.8 cm. This is most
compatible with a hemangioma. Portal vein is patent on color Doppler
imaging with normal direction of blood flow towards the liver.
IMPRESSION: Normal appearing gallbladder and common bile duct. Probable
hemangioma in the right hepatic lobe.

## 2021-06-28 IMAGING — CR CHEST - 2 VIEW
2 series · 2 of 2 positions shown · non-contrast
Comparison: 01/07/2018

CLINICAL DATA: Peripheral edema

EXAM:
CHEST - 2 VIEW

[chest lat]
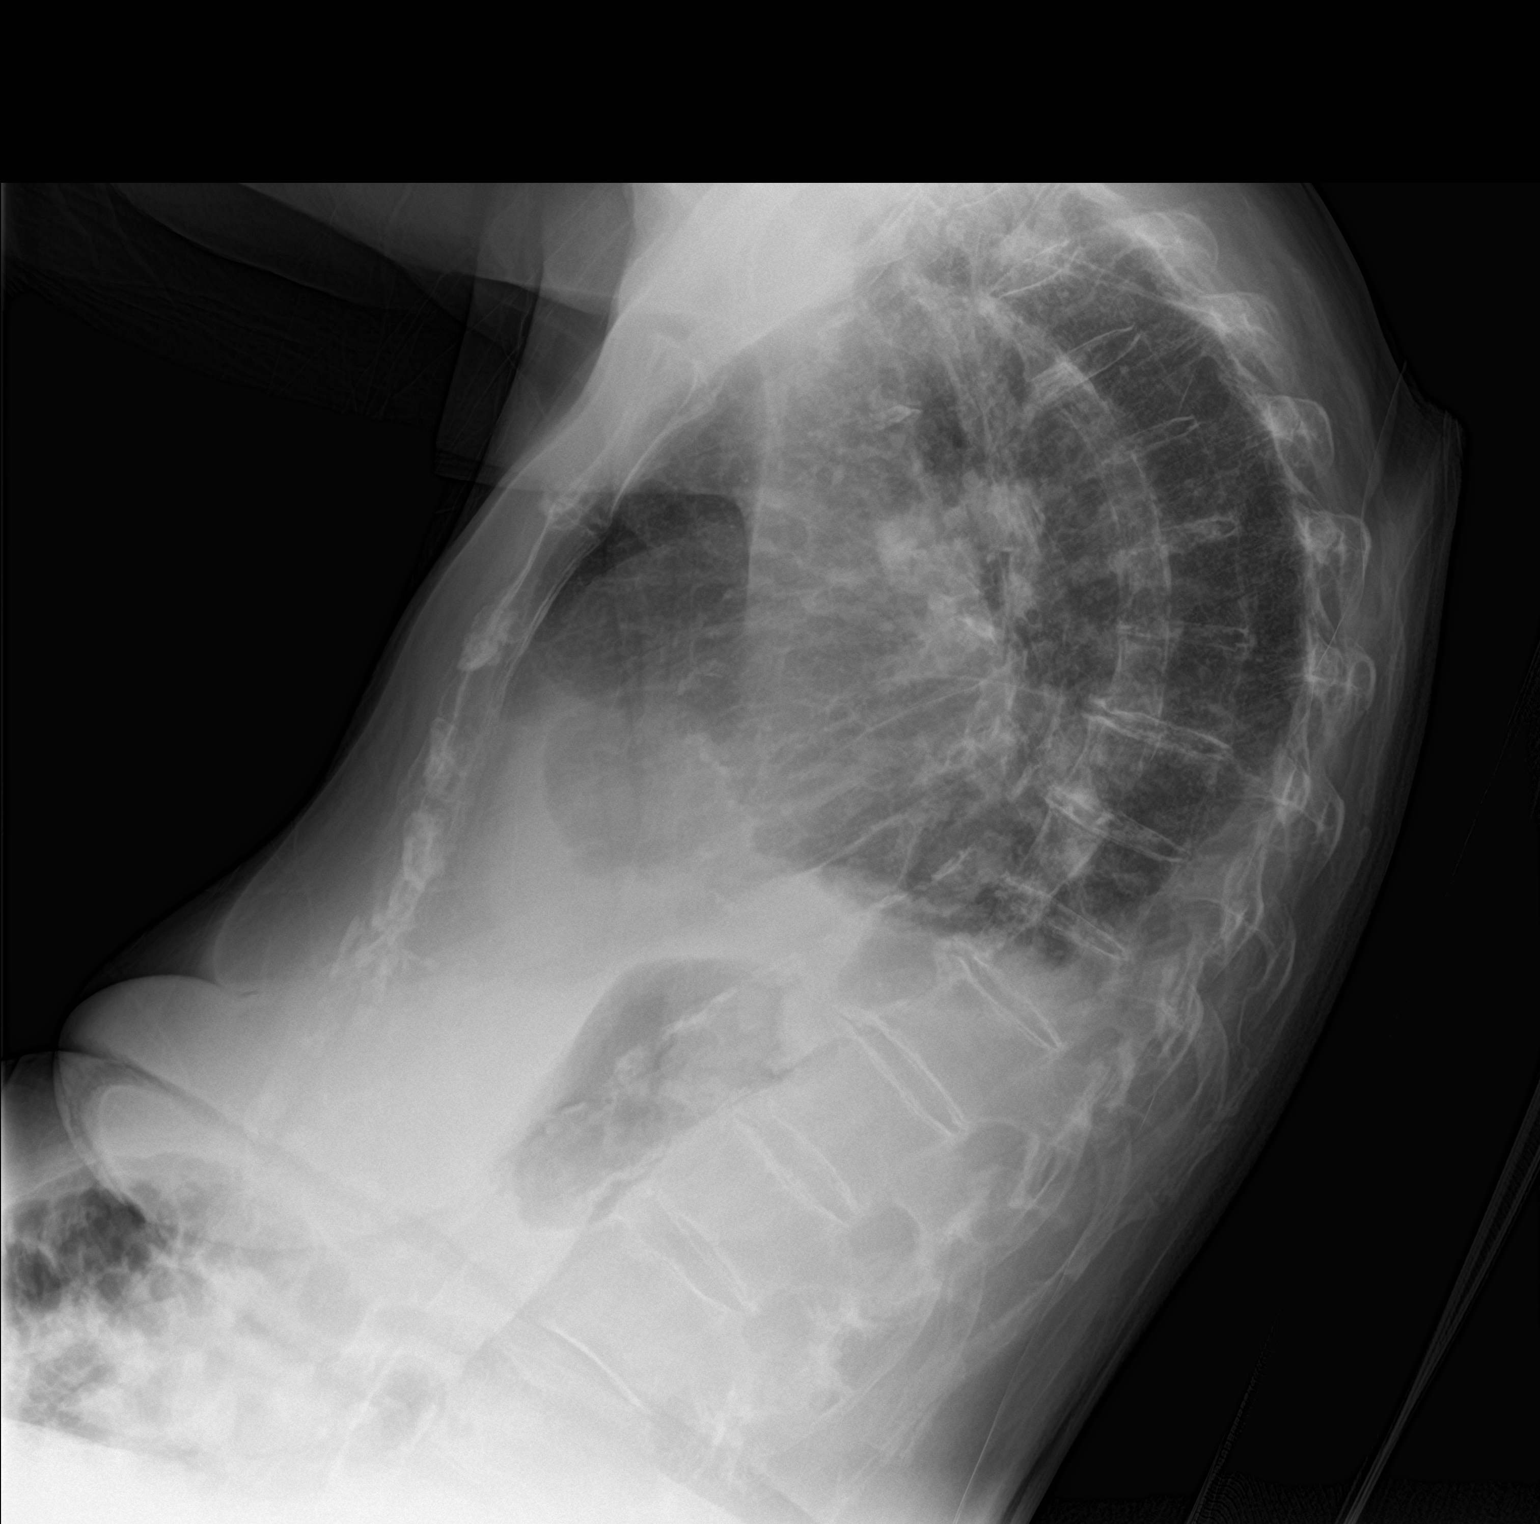

[chest ap]
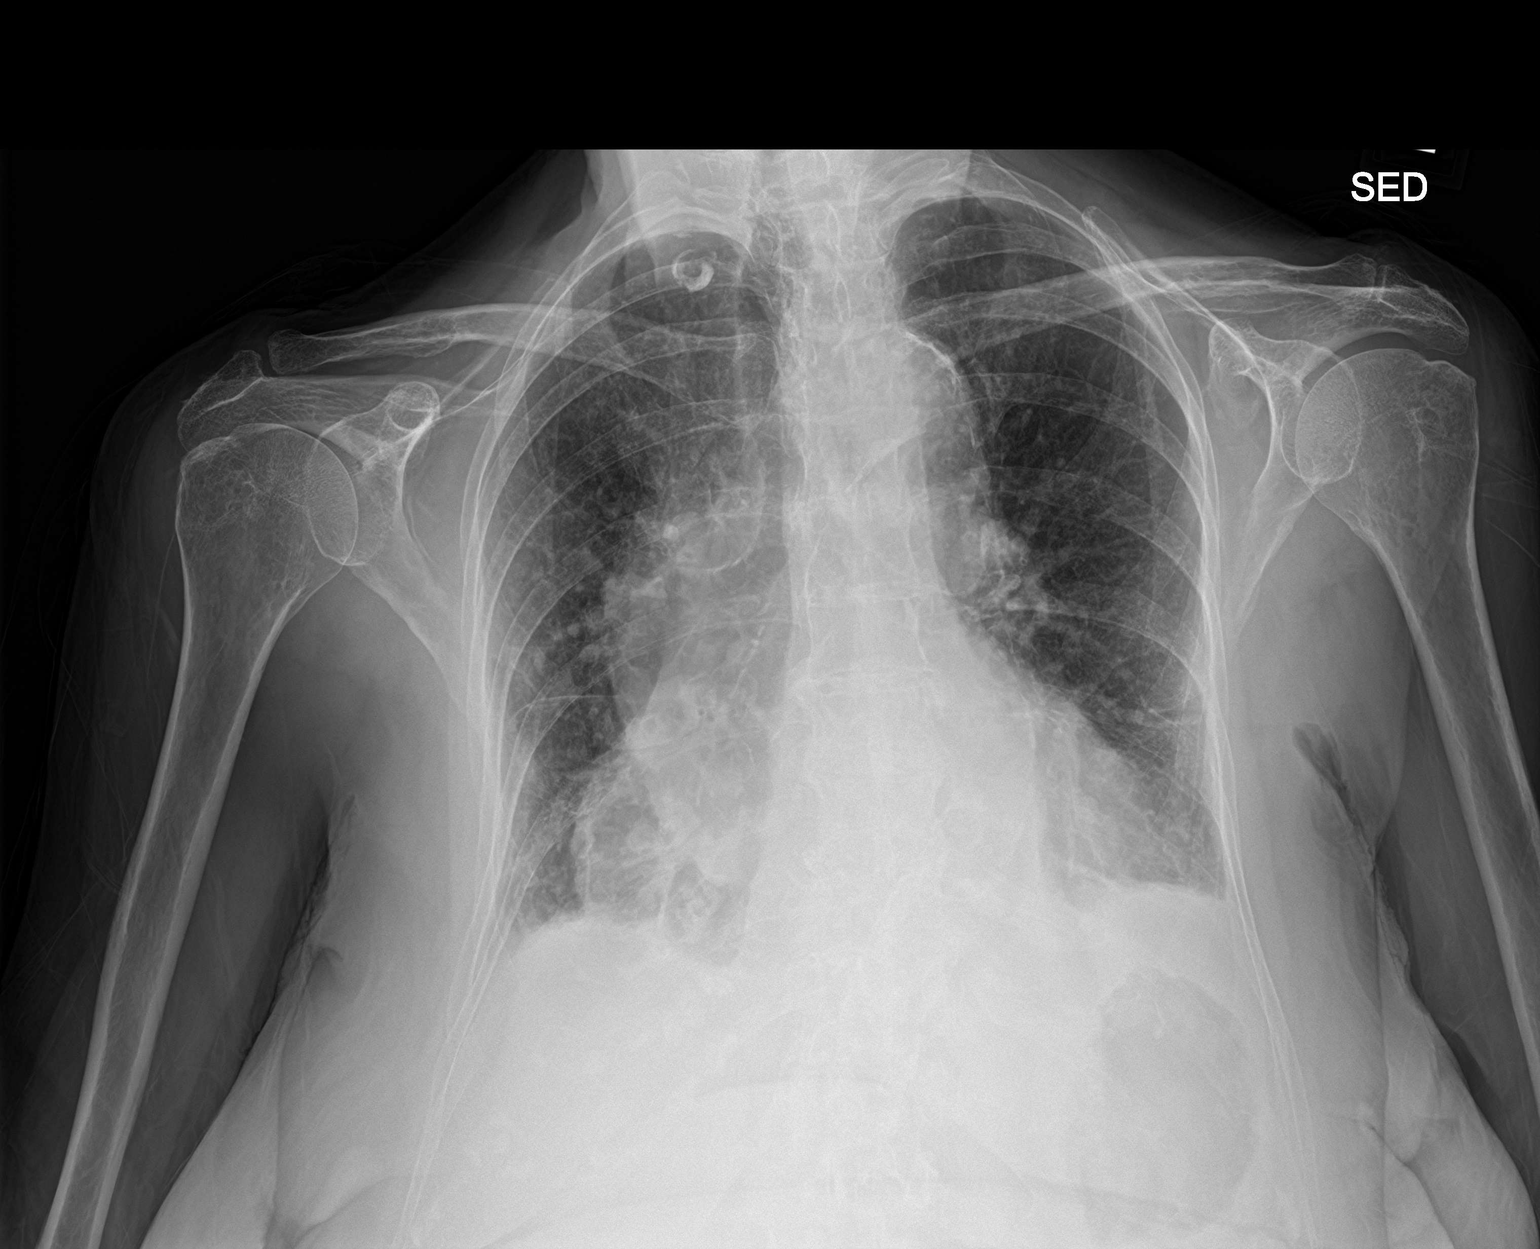

[2 of 2 positions shown; findings below may reference images not displayed]

FINDINGS: Cardiomegaly. No overt edema. No effusions. Linear scarring or
atelectasis in the lung bases. No acute bony abnormality.
IMPRESSION: Cardiomegaly.  Bibasilar atelectasis or scarring.
